# Patient Record
Sex: Male | Born: 1945 | Race: White | Hispanic: No | Marital: Married | Smoking: Former smoker
Health system: Southern US, Community
[De-identification: ages and names within clinical notes are randomized; demographics above are authoritative.]

## PROBLEM LIST (undated history)

## (undated) DIAGNOSIS — D369 Benign neoplasm, unspecified site: Secondary | ICD-10-CM

## (undated) DIAGNOSIS — I2510 Atherosclerotic heart disease of native coronary artery without angina pectoris: Secondary | ICD-10-CM

## (undated) DIAGNOSIS — F17200 Nicotine dependence, unspecified, uncomplicated: Secondary | ICD-10-CM

## (undated) DIAGNOSIS — I712 Thoracic aortic aneurysm, without rupture: Secondary | ICD-10-CM

## (undated) DIAGNOSIS — E119 Type 2 diabetes mellitus without complications: Secondary | ICD-10-CM

## (undated) DIAGNOSIS — I7121 Aneurysm of the ascending aorta, without rupture: Secondary | ICD-10-CM

## (undated) DIAGNOSIS — E7800 Pure hypercholesterolemia, unspecified: Secondary | ICD-10-CM

## (undated) DIAGNOSIS — I10 Essential (primary) hypertension: Secondary | ICD-10-CM

## (undated) HISTORY — DX: Aneurysm of the ascending aorta, without rupture: I71.21

## (undated) HISTORY — PX: EYE SURGERY: SHX253

## (undated) HISTORY — DX: Pure hypercholesterolemia, unspecified: E78.00

## (undated) HISTORY — DX: Thoracic aortic aneurysm, without rupture: I71.2

## (undated) HISTORY — DX: Atherosclerotic heart disease of native coronary artery without angina pectoris: I25.10

## (undated) HISTORY — DX: Essential (primary) hypertension: I10

## (undated) HISTORY — DX: Type 2 diabetes mellitus without complications: E11.9

## (undated) HISTORY — DX: Nicotine dependence, unspecified, uncomplicated: F17.200

## (undated) HISTORY — DX: Benign neoplasm, unspecified site: D36.9

---

## 2000-02-06 HISTORY — PX: LACERATION REPAIR: SHX5168

## 2000-12-04 ENCOUNTER — Encounter: Payer: Self-pay | Admitting: Emergency Medicine

## 2000-12-04 ENCOUNTER — Ambulatory Visit (HOSPITAL_COMMUNITY): Admission: EM | Admit: 2000-12-04 | Discharge: 2000-12-04 | Payer: Self-pay | Admitting: *Deleted

## 2006-03-01 ENCOUNTER — Encounter: Admission: RE | Admit: 2006-03-01 | Discharge: 2006-03-01 | Payer: Self-pay | Admitting: Family Medicine

## 2012-10-16 ENCOUNTER — Other Ambulatory Visit: Payer: Self-pay | Admitting: Family Medicine

## 2012-10-16 DIAGNOSIS — Z136 Encounter for screening for cardiovascular disorders: Secondary | ICD-10-CM

## 2012-11-03 ENCOUNTER — Ambulatory Visit
Admission: RE | Admit: 2012-11-03 | Discharge: 2012-11-03 | Disposition: A | Discharge status: Home / Self Care | Payer: Medicare Other | Source: Ambulatory Visit | Attending: Family Medicine | Admitting: Family Medicine

## 2012-11-03 DIAGNOSIS — Z136 Encounter for screening for cardiovascular disorders: Secondary | ICD-10-CM

## 2015-03-11 DIAGNOSIS — R768 Other specified abnormal immunological findings in serum: Secondary | ICD-10-CM | POA: Diagnosis not present

## 2015-03-11 DIAGNOSIS — I10 Essential (primary) hypertension: Secondary | ICD-10-CM | POA: Diagnosis not present

## 2015-03-17 DIAGNOSIS — H2511 Age-related nuclear cataract, right eye: Secondary | ICD-10-CM | POA: Diagnosis not present

## 2015-03-25 DIAGNOSIS — Z1211 Encounter for screening for malignant neoplasm of colon: Secondary | ICD-10-CM | POA: Diagnosis not present

## 2015-04-26 DIAGNOSIS — I10 Essential (primary) hypertension: Secondary | ICD-10-CM | POA: Diagnosis not present

## 2015-04-26 DIAGNOSIS — E7800 Pure hypercholesterolemia, unspecified: Secondary | ICD-10-CM | POA: Diagnosis not present

## 2015-04-27 DIAGNOSIS — I10 Essential (primary) hypertension: Secondary | ICD-10-CM | POA: Diagnosis not present

## 2015-04-27 DIAGNOSIS — E7800 Pure hypercholesterolemia, unspecified: Secondary | ICD-10-CM | POA: Diagnosis not present

## 2015-07-05 DIAGNOSIS — E7800 Pure hypercholesterolemia, unspecified: Secondary | ICD-10-CM | POA: Diagnosis not present

## 2015-10-18 DIAGNOSIS — I10 Essential (primary) hypertension: Secondary | ICD-10-CM | POA: Diagnosis not present

## 2015-10-18 DIAGNOSIS — F17210 Nicotine dependence, cigarettes, uncomplicated: Secondary | ICD-10-CM | POA: Diagnosis not present

## 2015-10-18 DIAGNOSIS — F17200 Nicotine dependence, unspecified, uncomplicated: Secondary | ICD-10-CM | POA: Diagnosis not present

## 2015-10-18 DIAGNOSIS — Z0001 Encounter for general adult medical examination with abnormal findings: Secondary | ICD-10-CM | POA: Diagnosis not present

## 2015-10-18 DIAGNOSIS — Z79899 Other long term (current) drug therapy: Secondary | ICD-10-CM | POA: Diagnosis not present

## 2015-10-18 DIAGNOSIS — E7800 Pure hypercholesterolemia, unspecified: Secondary | ICD-10-CM | POA: Diagnosis not present

## 2015-10-26 ENCOUNTER — Telehealth: Payer: Self-pay | Admitting: Acute Care

## 2015-10-26 NOTE — Telephone Encounter (Signed)
Wife called once again. Advised a note was back.

## 2015-10-26 NOTE — Telephone Encounter (Signed)
Pts wife is calling again to speak with SG> will forward message to her.

## 2015-10-27 NOTE — Telephone Encounter (Signed)
I have returned this call per the request. Ms. Mathus wanted to complete the questionnaire to determine if her husband was going to qualify for screening. I completed the questionnaire as requested and told Ms. Beardmore that Estill Bamberg will call her to schedule the appointment.She verbalized understanding and will await Amanda's call.

## 2015-10-28 ENCOUNTER — Telehealth: Payer: Self-pay | Admitting: Acute Care

## 2015-10-28 DIAGNOSIS — F17210 Nicotine dependence, cigarettes, uncomplicated: Secondary | ICD-10-CM

## 2015-11-02 NOTE — Telephone Encounter (Signed)
Lmtcb x1

## 2015-11-02 NOTE — Telephone Encounter (Signed)
Called spoke with pt's wife.  SDMV scheduled for 12/07/15 at 9am.  CT order placed.  She voiced understanding and had no further questions.

## 2015-11-02 NOTE — Telephone Encounter (Signed)
Pt wife calling back again to speak to nurse about scheduling appoint for the lung screening, please advice.Tristan Sims

## 2015-12-07 ENCOUNTER — Ambulatory Visit (INDEPENDENT_AMBULATORY_CARE_PROVIDER_SITE_OTHER)
Admission: RE | Admit: 2015-12-07 | Discharge: 2015-12-07 | Disposition: A | Discharge status: Home / Self Care | Payer: Medicare Other | Source: Ambulatory Visit | Attending: Acute Care | Admitting: Acute Care

## 2015-12-07 ENCOUNTER — Ambulatory Visit (INDEPENDENT_AMBULATORY_CARE_PROVIDER_SITE_OTHER): Payer: Medicare Other | Admitting: Acute Care

## 2015-12-07 ENCOUNTER — Telehealth: Payer: Self-pay | Admitting: Acute Care

## 2015-12-07 ENCOUNTER — Encounter: Payer: Self-pay | Admitting: Acute Care

## 2015-12-07 DIAGNOSIS — F17210 Nicotine dependence, cigarettes, uncomplicated: Secondary | ICD-10-CM | POA: Diagnosis not present

## 2015-12-07 DIAGNOSIS — Z87891 Personal history of nicotine dependence: Secondary | ICD-10-CM | POA: Diagnosis not present

## 2015-12-07 NOTE — Progress Notes (Signed)
Shared Decision Making Visit Lung Cancer Screening Program [phone])   Eligibility:  Age 70 y.o.  Pack Years Smoking History Calculation 106 pack year smoking history (# packs/per year x # years smoked)  Recent History of coughing up blood  no  Unexplained weight loss? no ( >Than 15 pounds within the last 6 months )  Prior History Lung / other cancer no (Diagnosis within the last 5 years already requiring surveillance chest CT Scans).  Smoking Status Current Smoker  Former Smokers: Years since quit: NA   Quit Date: NA  Visit Components:  Discussion included one or more decision making aids. yes  Discussion included risk/benefits of screening. yes  Discussion included potential follow up diagnostic testing for abnormal scans. yes  Discussion included meaning and risk of over diagnosis. yes  Discussion included meaning and risk of False Positives. yes  Discussion included meaning of total radiation exposure. yes  Counseling Included:  Importance of adherence to annual lung cancer LDCT screening. yes  Impact of comorbidities on ability to participate in the program. yes  Ability and willingness to under diagnostic treatment. yes  Smoking Cessation Counseling:  Current Smokers:   Discussed importance of smoking cessation. yes  Information about tobacco cessation classes and interventions provided to patient. yes  Patient provided with "ticket" for LDCT Scan. yes  Symptomatic Patient. no  Counseling  Diagnosis Code: Tobacco Use Z72.0  Asymptomatic Patient yes  Counseling (Intermediate counseling: > three minutes counseling) [id number]  Former Smokers:   Discussed the importance of maintaining cigarette abstinence. yes  Diagnosis Code: Personal History of Nicotine Dependence. [id number]  Information about tobacco cessation classes and interventions provided to patient. Yes  Patient provided with "ticket" for LDCT Scan. yes  Written Order for Lung Cancer  Screening with LDCT placed in Epic. Yes (CT Chest Lung Cancer Screening Low Dose W/O CM) [id number] Z12.2-Screening of respiratory organs Z87.891-Personal history of nicotine dependence  I have spent 20 minutes of face to face time with Mr. And Mrs. Gayler discussing the risks and benefits of lung cancer screening. We viewed a power point together that explained in detail the above noted topics. We paused at intervals to allow for questions to be asked and answered to ensure understanding.We discussed that the single most powerful action that he can take to decrease his risk of developing lung cancer is to quit smoking. We discussed whether or not he is ready to commit to setting a quit date. We discussed options for tools to aid in quitting smoking including nicotine replacement therapy, non-nicotine medications, support groups, Quit Smart classes, and behavior modification. We discussed that often times setting smaller, more achievable goals, such as eliminating 1 cigarette a day for a week and then 2 cigarettes a day for a week can be helpful in slowly decreasing the number of cigarettes smoked. This allows for a sense of accomplishment as well as providing a clinical benefit. I gave him  the " Be Stronger Than Your Excuses" card with contact information for community resources, classes, free nicotine replacement therapy, and access to mobile apps, text messaging, and on-line smoking cessation help. I have also given him  my card and contact information in the event he needs to contact me. We discussed the time and location of the scan, and that either June Leap, CMA, or I will call with the results within 24-48 hours of receiving them. I have provided him with a copy of the power point we viewed  as a resource in the  event they need reinforcement of the concepts we discussed today in the office. The patient verbalized understanding of all of  the above and had no further questions upon leaving the office.  They have my contact information in the event they have any further questions.   Magdalen Spatz, NP 12/07/2015

## 2015-12-07 NOTE — Telephone Encounter (Signed)
I have called the results of [name] to his wife [name] who was with him at the shared decision-making visit today. I explained to her that his scan was read as a Lung  RADS 3, nodules that are probably benign findings, short term follow up suggested: includes nodules with a low likelihood of becoming a clinically active cancer. Radiology recommends a 6 month repeat LDCT follow up. I told her that we will order and schedule the 6 month follow-up scan in April 2018.. I explained that there was an additional finding of coronary artery atherosclerosis and aortic atherosclerosis. The patient is currently being treated by his primary care physician with a statin medication. I explained to [name] that I will forward the results of the scan to the patient's primary care physician for completeness of his medical record. She has verbalized understanding of the above, and will let [name] know when he arrives home today. She had no further questions upon completion of the call. She has my contact information in the event they have questions in the future.

## 2016-04-16 DIAGNOSIS — R7301 Impaired fasting glucose: Secondary | ICD-10-CM | POA: Diagnosis not present

## 2016-04-24 DIAGNOSIS — E119 Type 2 diabetes mellitus without complications: Secondary | ICD-10-CM | POA: Diagnosis not present

## 2016-04-24 DIAGNOSIS — I10 Essential (primary) hypertension: Secondary | ICD-10-CM | POA: Diagnosis not present

## 2016-06-06 ENCOUNTER — Ambulatory Visit (INDEPENDENT_AMBULATORY_CARE_PROVIDER_SITE_OTHER)
Admission: RE | Admit: 2016-06-06 | Discharge: 2016-06-06 | Disposition: A | Discharge status: Home / Self Care | Payer: Medicare Other | Source: Ambulatory Visit | Attending: Acute Care | Admitting: Acute Care

## 2016-06-06 DIAGNOSIS — R911 Solitary pulmonary nodule: Secondary | ICD-10-CM

## 2016-06-06 DIAGNOSIS — F17210 Nicotine dependence, cigarettes, uncomplicated: Secondary | ICD-10-CM

## 2016-06-11 ENCOUNTER — Telehealth: Payer: Self-pay | Admitting: Acute Care

## 2016-06-11 DIAGNOSIS — F17210 Nicotine dependence, cigarettes, uncomplicated: Secondary | ICD-10-CM

## 2016-06-12 NOTE — Telephone Encounter (Signed)
Patient's wife returning call for CT results, states she will be leaving town in the am and may be reached at [phone].

## 2016-06-13 NOTE — Telephone Encounter (Signed)
Will forward to the lung screening pool 

## 2016-06-13 NOTE — Telephone Encounter (Signed)
Patient wife returning Denise's call - she can be reached at [phone] -pr

## 2016-06-15 NOTE — Telephone Encounter (Signed)
Pt's wife informed of CT results per Sarah Groce, NP.  Pt's wife verbalized understanding.  Copy sent to PCP.  Order placed for 1 yr f/u CT.  

## 2016-07-05 DIAGNOSIS — H43813 Vitreous degeneration, bilateral: Secondary | ICD-10-CM | POA: Diagnosis not present

## 2016-07-05 DIAGNOSIS — E119 Type 2 diabetes mellitus without complications: Secondary | ICD-10-CM | POA: Diagnosis not present

## 2016-07-05 DIAGNOSIS — Z961 Presence of intraocular lens: Secondary | ICD-10-CM | POA: Diagnosis not present

## 2016-07-05 DIAGNOSIS — H43811 Vitreous degeneration, right eye: Secondary | ICD-10-CM | POA: Diagnosis not present

## 2016-07-27 DIAGNOSIS — E119 Type 2 diabetes mellitus without complications: Secondary | ICD-10-CM | POA: Diagnosis not present

## 2016-07-27 DIAGNOSIS — Z79899 Other long term (current) drug therapy: Secondary | ICD-10-CM | POA: Diagnosis not present

## 2016-07-27 DIAGNOSIS — I10 Essential (primary) hypertension: Secondary | ICD-10-CM | POA: Diagnosis not present

## 2016-10-22 DIAGNOSIS — D126 Benign neoplasm of colon, unspecified: Secondary | ICD-10-CM | POA: Diagnosis not present

## 2016-10-22 DIAGNOSIS — Z1211 Encounter for screening for malignant neoplasm of colon: Secondary | ICD-10-CM | POA: Diagnosis not present

## 2016-10-22 DIAGNOSIS — K648 Other hemorrhoids: Secondary | ICD-10-CM | POA: Diagnosis not present

## 2016-10-24 DIAGNOSIS — Z0001 Encounter for general adult medical examination with abnormal findings: Secondary | ICD-10-CM | POA: Diagnosis not present

## 2016-10-24 DIAGNOSIS — I10 Essential (primary) hypertension: Secondary | ICD-10-CM | POA: Diagnosis not present

## 2016-10-24 DIAGNOSIS — E7800 Pure hypercholesterolemia, unspecified: Secondary | ICD-10-CM | POA: Diagnosis not present

## 2016-10-24 DIAGNOSIS — F17200 Nicotine dependence, unspecified, uncomplicated: Secondary | ICD-10-CM | POA: Diagnosis not present

## 2016-10-25 DIAGNOSIS — Z1211 Encounter for screening for malignant neoplasm of colon: Secondary | ICD-10-CM | POA: Diagnosis not present

## 2016-10-25 DIAGNOSIS — D126 Benign neoplasm of colon, unspecified: Secondary | ICD-10-CM | POA: Diagnosis not present

## 2016-11-30 DIAGNOSIS — Z0000 Encounter for general adult medical examination without abnormal findings: Secondary | ICD-10-CM | POA: Diagnosis not present

## 2016-12-31 DIAGNOSIS — R05 Cough: Secondary | ICD-10-CM | POA: Diagnosis not present

## 2016-12-31 DIAGNOSIS — J209 Acute bronchitis, unspecified: Secondary | ICD-10-CM | POA: Diagnosis not present

## 2016-12-31 DIAGNOSIS — R509 Fever, unspecified: Secondary | ICD-10-CM | POA: Diagnosis not present

## 2017-01-09 DIAGNOSIS — I10 Essential (primary) hypertension: Secondary | ICD-10-CM | POA: Diagnosis not present

## 2017-01-09 DIAGNOSIS — J209 Acute bronchitis, unspecified: Secondary | ICD-10-CM | POA: Diagnosis not present

## 2017-04-30 DIAGNOSIS — E119 Type 2 diabetes mellitus without complications: Secondary | ICD-10-CM | POA: Diagnosis not present

## 2017-04-30 DIAGNOSIS — I10 Essential (primary) hypertension: Secondary | ICD-10-CM | POA: Diagnosis not present

## 2017-04-30 DIAGNOSIS — F17200 Nicotine dependence, unspecified, uncomplicated: Secondary | ICD-10-CM | POA: Diagnosis not present

## 2017-04-30 DIAGNOSIS — E7800 Pure hypercholesterolemia, unspecified: Secondary | ICD-10-CM | POA: Diagnosis not present

## 2017-11-05 DIAGNOSIS — I10 Essential (primary) hypertension: Secondary | ICD-10-CM | POA: Diagnosis not present

## 2017-11-05 DIAGNOSIS — Z0001 Encounter for general adult medical examination with abnormal findings: Secondary | ICD-10-CM | POA: Diagnosis not present

## 2017-11-05 DIAGNOSIS — F17200 Nicotine dependence, unspecified, uncomplicated: Secondary | ICD-10-CM | POA: Diagnosis not present

## 2017-11-05 DIAGNOSIS — E7800 Pure hypercholesterolemia, unspecified: Secondary | ICD-10-CM | POA: Diagnosis not present

## 2017-11-08 ENCOUNTER — Other Ambulatory Visit: Payer: Self-pay | Admitting: Acute Care

## 2017-11-08 DIAGNOSIS — F17210 Nicotine dependence, cigarettes, uncomplicated: Secondary | ICD-10-CM

## 2017-11-08 DIAGNOSIS — Z87891 Personal history of nicotine dependence: Secondary | ICD-10-CM

## 2017-11-08 DIAGNOSIS — Z122 Encounter for screening for malignant neoplasm of respiratory organs: Secondary | ICD-10-CM

## 2017-11-21 ENCOUNTER — Ambulatory Visit (INDEPENDENT_AMBULATORY_CARE_PROVIDER_SITE_OTHER)
Admission: RE | Admit: 2017-11-21 | Discharge: 2017-11-21 | Disposition: A | Discharge status: Home / Self Care | Payer: Medicare Other | Source: Ambulatory Visit | Attending: Acute Care | Admitting: Acute Care

## 2017-11-21 DIAGNOSIS — Z87891 Personal history of nicotine dependence: Secondary | ICD-10-CM | POA: Diagnosis not present

## 2017-11-21 DIAGNOSIS — F17210 Nicotine dependence, cigarettes, uncomplicated: Secondary | ICD-10-CM

## 2017-11-21 DIAGNOSIS — Z122 Encounter for screening for malignant neoplasm of respiratory organs: Secondary | ICD-10-CM

## 2017-11-25 ENCOUNTER — Other Ambulatory Visit: Payer: Self-pay | Admitting: Acute Care

## 2017-11-25 DIAGNOSIS — Z87891 Personal history of nicotine dependence: Secondary | ICD-10-CM

## 2017-11-25 DIAGNOSIS — F17210 Nicotine dependence, cigarettes, uncomplicated: Secondary | ICD-10-CM

## 2017-11-25 DIAGNOSIS — Z122 Encounter for screening for malignant neoplasm of respiratory organs: Secondary | ICD-10-CM

## 2018-02-04 DIAGNOSIS — H11153 Pinguecula, bilateral: Secondary | ICD-10-CM | POA: Diagnosis not present

## 2018-02-04 DIAGNOSIS — Z961 Presence of intraocular lens: Secondary | ICD-10-CM | POA: Diagnosis not present

## 2018-02-04 DIAGNOSIS — E119 Type 2 diabetes mellitus without complications: Secondary | ICD-10-CM | POA: Diagnosis not present

## 2018-02-04 DIAGNOSIS — H43393 Other vitreous opacities, bilateral: Secondary | ICD-10-CM | POA: Diagnosis not present

## 2018-02-11 DIAGNOSIS — E119 Type 2 diabetes mellitus without complications: Secondary | ICD-10-CM | POA: Diagnosis not present

## 2018-05-08 DIAGNOSIS — I712 Thoracic aortic aneurysm, without rupture: Secondary | ICD-10-CM | POA: Diagnosis not present

## 2018-05-08 DIAGNOSIS — E7800 Pure hypercholesterolemia, unspecified: Secondary | ICD-10-CM | POA: Diagnosis not present

## 2018-05-08 DIAGNOSIS — I10 Essential (primary) hypertension: Secondary | ICD-10-CM | POA: Diagnosis not present

## 2018-05-08 DIAGNOSIS — E119 Type 2 diabetes mellitus without complications: Secondary | ICD-10-CM | POA: Diagnosis not present

## 2018-08-26 DIAGNOSIS — Z79899 Other long term (current) drug therapy: Secondary | ICD-10-CM | POA: Diagnosis not present

## 2018-08-26 DIAGNOSIS — I10 Essential (primary) hypertension: Secondary | ICD-10-CM | POA: Diagnosis not present

## 2018-08-26 DIAGNOSIS — E119 Type 2 diabetes mellitus without complications: Secondary | ICD-10-CM | POA: Diagnosis not present

## 2018-08-26 DIAGNOSIS — E7800 Pure hypercholesterolemia, unspecified: Secondary | ICD-10-CM | POA: Diagnosis not present

## 2018-11-12 ENCOUNTER — Other Ambulatory Visit: Payer: Self-pay | Admitting: Family Medicine

## 2018-11-12 DIAGNOSIS — I7121 Aneurysm of the ascending aorta, without rupture: Secondary | ICD-10-CM

## 2018-11-12 DIAGNOSIS — I712 Thoracic aortic aneurysm, without rupture: Secondary | ICD-10-CM

## 2018-11-19 DIAGNOSIS — Z23 Encounter for immunization: Secondary | ICD-10-CM | POA: Diagnosis not present

## 2018-11-19 DIAGNOSIS — Z0001 Encounter for general adult medical examination with abnormal findings: Secondary | ICD-10-CM | POA: Diagnosis not present

## 2018-11-19 DIAGNOSIS — E7800 Pure hypercholesterolemia, unspecified: Secondary | ICD-10-CM | POA: Diagnosis not present

## 2018-11-19 DIAGNOSIS — I10 Essential (primary) hypertension: Secondary | ICD-10-CM | POA: Diagnosis not present

## 2018-11-20 ENCOUNTER — Ambulatory Visit
Admission: RE | Admit: 2018-11-20 | Discharge: 2018-11-20 | Disposition: A | Discharge status: Home / Self Care | Payer: Medicare Other | Source: Ambulatory Visit | Attending: Family Medicine | Admitting: Family Medicine

## 2018-11-20 DIAGNOSIS — I7121 Aneurysm of the ascending aorta, without rupture: Secondary | ICD-10-CM

## 2018-11-20 DIAGNOSIS — I712 Thoracic aortic aneurysm, without rupture: Secondary | ICD-10-CM | POA: Diagnosis not present

## 2018-11-20 MED ORDER — IOPAMIDOL (ISOVUE-370) INJECTION 76%
75.0000 mL | Freq: Once | INTRAVENOUS | Status: AC | PRN
  Administered 2018-11-20: 75 mL via INTRAVENOUS

## 2018-12-11 ENCOUNTER — Other Ambulatory Visit: Payer: Self-pay

## 2018-12-11 ENCOUNTER — Telehealth: Payer: Self-pay | Admitting: Acute Care

## 2018-12-11 ENCOUNTER — Ambulatory Visit (INDEPENDENT_AMBULATORY_CARE_PROVIDER_SITE_OTHER)
Admission: RE | Admit: 2018-12-11 | Discharge: 2018-12-11 | Disposition: A | Discharge status: Home / Self Care | Payer: Medicare Other | Source: Ambulatory Visit | Attending: Acute Care | Admitting: Acute Care

## 2018-12-11 DIAGNOSIS — Z87891 Personal history of nicotine dependence: Secondary | ICD-10-CM | POA: Diagnosis not present

## 2018-12-11 DIAGNOSIS — Z122 Encounter for screening for malignant neoplasm of respiratory organs: Secondary | ICD-10-CM

## 2018-12-11 DIAGNOSIS — F17210 Nicotine dependence, cigarettes, uncomplicated: Secondary | ICD-10-CM

## 2018-12-11 NOTE — Telephone Encounter (Signed)
I have called Lincoln Brigham, the patient's wife with the results of his low-dose CT.  I explained that his scan was read as a Lung RADS 4 A : suspicious findings, either short term follow up in 3 months or alternatively  PET Scan evaluation may be considered when there is a solid component of  8 mm or larger.  I explained that we need to do it follow-up CT in 3 months rather than waiting a year.  I explained that this will allow [place] to assess the nodule in question for stability, resolution, or continued growth.  She verbalized understanding.  I explained that someone will call closer to February 2021 to set up the scan.  She verbalized understanding.  I also explained that there was notation of Hepatic steatosis. This  is a term that describes the build up of fat in the liver. It is normal to have small amounts of fat in your liver, but when the proportion of liver cells that contain fat exceeds more than 5% it is indicative of early stage fatty liver.Treatment often involves reducing risk factors through a diet and exercise plan. It is generally a benign condition, but in a small percentage of patients it does require follow up. Please have the patient follow up with PCP regarding potential risk factor modification, dietary therapy or pharmacologic therapy if clinically indicated.  Denise please order low-dose CT follow-up in 3 months. Please fax results to PCP Dr. Dorthy Cooler. Please also make sure Dr. Raliegh Ip is aware of the fact that there was notation of hepatic steatosis on the scan. Thanks so much    .

## 2018-12-11 NOTE — Telephone Encounter (Signed)
Received call report from [place] at [facility] :   IMPRESSION: 1. Lung-RADS 4A, suspicious. Follow up low-dose chest CT without contrast in 3 months (please use the following order, "CT CHEST LCS NODULE FOLLOW-UP W/O CM") is recommended. Alternatively, PET may be considered when there is a solid component 64mm or larger. 2. Three-vessel coronary atherosclerosis. 3. Diffuse hepatic steatosis.  Aortic Atherosclerosis (ICD10-I70.0) and Emphysema (ICD10-J43.9).  Will forward to Eric Form, NP.

## 2018-12-15 NOTE — Telephone Encounter (Signed)
Order placed for 3 month f/u low dose ct. Results faxed to PCP.

## 2018-12-15 NOTE — Addendum Note (Signed)
Addended by: Doroteo Glassman D on: 12/15/2018 09:55 AM   Modules accepted: Orders

## 2018-12-27 DIAGNOSIS — Z20828 Contact with and (suspected) exposure to other viral communicable diseases: Secondary | ICD-10-CM | POA: Diagnosis not present

## 2019-03-16 ENCOUNTER — Ambulatory Visit (INDEPENDENT_AMBULATORY_CARE_PROVIDER_SITE_OTHER)
Admission: RE | Admit: 2019-03-16 | Discharge: 2019-03-16 | Disposition: A | Discharge status: Home / Self Care | Payer: Medicare Other | Source: Ambulatory Visit | Attending: Acute Care | Admitting: Acute Care

## 2019-03-16 ENCOUNTER — Other Ambulatory Visit: Payer: Self-pay

## 2019-03-16 DIAGNOSIS — Z87891 Personal history of nicotine dependence: Secondary | ICD-10-CM

## 2019-03-16 DIAGNOSIS — J439 Emphysema, unspecified: Secondary | ICD-10-CM | POA: Diagnosis not present

## 2019-03-16 DIAGNOSIS — R911 Solitary pulmonary nodule: Secondary | ICD-10-CM

## 2019-03-16 DIAGNOSIS — Z122 Encounter for screening for malignant neoplasm of respiratory organs: Secondary | ICD-10-CM

## 2019-04-02 NOTE — Progress Notes (Signed)
I have called the patient to give him the scan results. There was no answer. I have left a message asking that the patient call the office for his results. We will await return call. If we do not hear back we will call the patient again.

## 2019-04-03 ENCOUNTER — Telehealth: Payer: Self-pay | Admitting: Acute Care

## 2019-04-03 DIAGNOSIS — Z87891 Personal history of nicotine dependence: Secondary | ICD-10-CM

## 2019-04-07 NOTE — Telephone Encounter (Signed)
Spoke with pt and advised that per radiology recommendation we will repeat CT in 6 months to f/u on the stability of nodules that were noted on recent scan. Pt verbalized understanding. Copy of CT sent to PCP. Order placed for 6 mth f/u low dose chest ct. Nothing further needed at this time.

## 2019-05-20 DIAGNOSIS — E7800 Pure hypercholesterolemia, unspecified: Secondary | ICD-10-CM | POA: Diagnosis not present

## 2019-05-20 DIAGNOSIS — E119 Type 2 diabetes mellitus without complications: Secondary | ICD-10-CM | POA: Diagnosis not present

## 2019-05-20 DIAGNOSIS — I712 Thoracic aortic aneurysm, without rupture: Secondary | ICD-10-CM | POA: Diagnosis not present

## 2019-05-20 DIAGNOSIS — I10 Essential (primary) hypertension: Secondary | ICD-10-CM | POA: Diagnosis not present

## 2019-05-26 ENCOUNTER — Other Ambulatory Visit: Payer: Self-pay | Admitting: Family Medicine

## 2019-05-26 DIAGNOSIS — R911 Solitary pulmonary nodule: Secondary | ICD-10-CM

## 2019-06-03 DIAGNOSIS — Z7984 Long term (current) use of oral hypoglycemic drugs: Secondary | ICD-10-CM | POA: Diagnosis not present

## 2019-06-03 DIAGNOSIS — Z79899 Other long term (current) drug therapy: Secondary | ICD-10-CM | POA: Diagnosis not present

## 2019-06-03 DIAGNOSIS — E119 Type 2 diabetes mellitus without complications: Secondary | ICD-10-CM | POA: Diagnosis not present

## 2019-06-16 ENCOUNTER — Ambulatory Visit: Payer: Medicare Other | Admitting: Cardiovascular Disease

## 2019-06-16 ENCOUNTER — Other Ambulatory Visit: Payer: Self-pay

## 2019-06-16 ENCOUNTER — Encounter (INDEPENDENT_AMBULATORY_CARE_PROVIDER_SITE_OTHER): Payer: Self-pay

## 2019-06-16 ENCOUNTER — Encounter: Payer: Self-pay | Admitting: Cardiovascular Disease

## 2019-06-16 VITALS — BP 132/78 | HR 60 | Ht 69.0 in | Wt 200.8 lb

## 2019-06-16 DIAGNOSIS — I2510 Atherosclerotic heart disease of native coronary artery without angina pectoris: Secondary | ICD-10-CM | POA: Diagnosis not present

## 2019-06-16 DIAGNOSIS — E785 Hyperlipidemia, unspecified: Secondary | ICD-10-CM

## 2019-06-16 DIAGNOSIS — I2584 Coronary atherosclerosis due to calcified coronary lesion: Secondary | ICD-10-CM

## 2019-06-16 LAB — HEPATIC FUNCTION PANEL
ALT: 70 IU/L — ABNORMAL HIGH (ref 0–44)
AST: 48 IU/L — ABNORMAL HIGH (ref 0–40)
Albumin: 4.4 g/dL (ref 3.7–4.7)
Alkaline Phosphatase: 52 IU/L (ref 39–117)
Bilirubin Total: 0.4 mg/dL (ref 0.0–1.2)
Bilirubin, Direct: 0.18 mg/dL (ref 0.00–0.40)
Total Protein: 6.8 g/dL (ref 6.0–8.5)

## 2019-06-16 LAB — BASIC METABOLIC PANEL
BUN/Creatinine Ratio: 18 (ref 10–24)
BUN: 15 mg/dL (ref 8–27)
CO2: 22 mmol/L (ref 20–29)
Calcium: 9.6 mg/dL (ref 8.6–10.2)
Chloride: 106 mmol/L (ref 96–106)
Creatinine, Ser: 0.84 mg/dL (ref 0.76–1.27)
GFR calc Af Amer: 100 mL/min/{1.73_m2} (ref >59)
GFR calc non Af Amer: 87 mL/min/{1.73_m2} (ref >59)
Glucose: 143 mg/dL — ABNORMAL HIGH (ref 65–99)
Potassium: 4.2 mmol/L (ref 3.5–5.2)
Sodium: 141 mmol/L (ref 134–144)

## 2019-06-16 LAB — LIPID PANEL
Chol/HDL Ratio: 3.9 ratio (ref 0.0–5.0)
Cholesterol, Total: 118 mg/dL (ref 100–199)
HDL: 30 mg/dL — ABNORMAL LOW (ref >39)
LDL Chol Calc (NIH): 53 mg/dL (ref 0–99)
Triglycerides: 217 mg/dL — ABNORMAL HIGH (ref 0–149)
VLDL Cholesterol Cal: 35 mg/dL (ref 5–40)

## 2019-06-16 NOTE — Patient Instructions (Signed)
Medication Instructions:  Your physician recommends that you continue on your current medications as directed. Please refer to the Current Medication list given to you today.  *If you need a refill on your cardiac medications before your next appointment, please call your pharmacy*   Lab Work: TODAY - cholesterol, liver panel, BMET (kidney function/electrolytes) If you have labs (blood work) drawn today and your tests are completely normal, you will receive your results only by: Marland Kitchen MyChart Message (if you have MyChart) OR . A paper copy in the mail If you have any lab test that is abnormal or we need to change your treatment, we will call you to review the results.   Testing/Procedures: None Ordered   Follow-Up: At [facility], you and your health needs are our priority.  As part of our continuing mission to provide you with exceptional heart care, we have created designated Provider Care Teams.  These Care Teams include your primary Cardiologist (physician) and Advanced Practice Providers (APPs -  Physician Assistants and Nurse Practitioners) who all work together to provide you with the care you need, when you need it.  We recommend signing up for the patient portal called "MyChart".  Sign up information is provided on this After Visit Summary.  MyChart is used to connect with patients for Virtual Visits (Telemedicine).  Patients are able to view lab/test results, encounter notes, upcoming appointments, etc.  Non-urgent messages can be sent to your provider as well.   To learn more about what you can do with MyChart, go to [email or web address].    Your next appointment:   1 year(s)  The format for your next appointment:   In Person  Provider:   You may see Mertie Moores, MD or one of the following Advanced Practice Providers on your designated Care Team:    Richardson Dopp, PA-C  [place], [place]

## 2019-06-16 NOTE — Progress Notes (Signed)
Cardiology Office Note:    Date:  06/16/2019   ID:  Tristan Sims, DOB 03/24/1945, MRN [id number]  PCP:  [name], MD  Cardiologist:  No primary care provider on file.  Electrophysiologist:  None   Referring MD: [name], MD   Chief Complaint  Patient presents with  . Coronary Artery Disease     History of Present Illness:    Tristan Seaberry is a 74 y.o. male with a hx of hypertension, diabetes mellitus and hyperlipidemia.  He also has a history of an ascending aortic aneurysm.  He had a CT scan recently that revealed some coronary artery calcifications.  We are asked to see him today for further evaluation of his coronary artery calcifications.  Recent CT scan reveals an ascending aorta measuring 4.0 cm.     Labs from his primary medical doctor reveals a total cholesterol of 110.  The triglyceride level was 218.  His HDL is 29.  LDL is 37.  Is very active.  Is a Museum/gallery curator.   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  No CP or tightness   Former smoker ,  Quit 2 years ago .     Past Medical History:  Diagnosis Date  . Adenomatous polyps   . Ascending aortic aneurysm ([place])   . CAD (coronary artery disease)   . DM (diabetes mellitus), type 2 ([place])   . HTN (hypertension)   . Hypercholesteremia   . Tobacco dependence     History reviewed. No pertinent surgical history.  Current Medications: Current Meds  Medication Sig  . atorvastatin (LIPITOR) 10 MG tablet Take 10 mg by mouth daily.  . cyclobenzaprine (FLEXERIL) 10 MG tablet Take 10 mg by mouth 3 (three) times daily as needed for muscle spasms.  Marland Kitchen lisinopril (ZESTRIL) 40 MG tablet Take 40 mg by mouth daily.  . metFORMIN (GLUCOPHAGE) 1000 MG tablet Take 1,000 mg by mouth 2 (two) times daily with a meal.  . naproxen (NAPROSYN) 500 MG tablet Take 500 mg by mouth 2 (two) times daily with a meal.     Allergies:   Aspirin and Penicillins   Social History   Socioeconomic History  . Marital status: Married     Spouse name: Not on file  . Number of children: Not on file  . Years of education: Not on file  . Highest education level: Not on file  Occupational History  . Not on file  Tobacco Use  . Smoking status: Current Every Day Smoker    Packs/day: 2.00    Years: 53.00    Pack years: 106.00    Types: Cigarettes  . Smokeless tobacco: Never Used  . Tobacco comment: Counseled about need to quit smoking  Substance and Sexual Activity  . Alcohol use: Not on file  . Drug use: Not on file  . Sexual activity: Not on file  Other Topics Concern  . Not on file  Social History Narrative  . Not on file   Social Determinants of Health   Financial Resource Strain:   . Difficulty of Paying Living Expenses:   Food Insecurity:   . Worried About Charity fundraiser in the Last Year:   . Arboriculturist in the Last Year:   Transportation Needs:   . Film/video editor (Medical):   Marland Kitchen Lack of Transportation (Non-Medical):   Physical Activity:   . Days of Exercise per Week:   . Minutes of Exercise per Session:   Stress:   .  Feeling of Stress :   Social Connections:   . Frequency of Communication with Friends and Family:   . Frequency of Social Gatherings with Friends and Family:   . Attends Religious Services:   . Active Member of Clubs or Organizations:   . Attends Archivist Meetings:   Marland Kitchen Marital Status:      Family History: The patient's family history includes Healthy in his father and mother.  Mother and father lived in [place].   Never went to the doctor   ROS:   Please see the history of present illness.     All other systems reviewed and are negative.  EKGs/Labs/Other Studies Reviewed:    The following studies were reviewed today:     Recent Labs: 06/16/2019: ALT 70; BUN 15; Creatinine, Ser 0.84; Potassium 4.2; Sodium 141  Recent Lipid Panel    Component Value Date/Time   CHOL 118 06/16/2019 0919   TRIG 217 (H) 06/16/2019 0919   HDL 30 (L) 06/16/2019  0919   CHOLHDL 3.9 06/16/2019 0919   LDLCALC 53 06/16/2019 0919    Physical Exam:    VS:  BP 132/78   Pulse 60   Ht 5\' 9"  (1.753 m)   Wt 200 lb 12 oz (91.1 kg)   SpO2 97%   BMI 29.65 kg/m     Wt Readings from Last 3 Encounters:  06/16/19 200 lb 12 oz (91.1 kg)     GEN: Middle-aged gentleman, no acute distress HEENT: Normal NECK: No JVD; No carotid bruits LYMPHATICS: No lymphadenopathy CARDIAC: RRR, no murmurs, rubs, gallops RESPIRATORY:  Clear to auscultation without rales, wheezing or rhonchi  ABDOMEN: Soft, non-tender, non-distended MUSCULOSKELETAL:  No edema; No deformity distal leg pulses are 1-2+. SKIN: Warm and dry NEUROLOGIC:  Alert and oriented x 3 PSYCHIATRIC:  Normal affect   EKG:    Normal sinus rhythm at 62.  No ST or T wave changes.  ASSESSMENT:    1. Coronary artery calcification seen on CT scan   2. Hyperlipidemia, unspecified hyperlipidemia type    PLAN:    In order of problems listed above:  1. Coronary artery calcifications: Tristan Sims was recently found to have coronary artery calcifications.  This was an incidental finding in follow-up of his ascending aortic dilatation.  He has three-vessel coronary artery calcifications.  He does not have any angina.  His lipids are very well controlled with an LDL of 37.  At this point I do not think that he needs any further work-up.  He is completely asymptomatic and his LDL is quite low.  He has stopped smoking.   I have asked him to get some regular exercise.  I told him to give [place] a call if he develops any episodes of angina   .  2.  Hyperlipidemia: He is currently on atorvastatin.  Will check lipids, liver enzymes, basic metabolic profile today.   Medication Adjustments/Labs and Tests Ordered: Current medicines are reviewed at length with the patient today.  Concerns regarding medicines are outlined above.  Orders Placed This Encounter  Procedures  . Lipid Profile  . Basic Metabolic Panel (BMET)  .  Hepatic function panel  . EKG 12-Lead   No orders of the defined types were placed in this encounter.   Patient Instructions  Medication Instructions:  Your physician recommends that you continue on your current medications as directed. Please refer to the Current Medication list given to you today.  *If you need a refill on  your cardiac medications before your next appointment, please call your pharmacy*   Lab Work: TODAY - cholesterol, liver panel, BMET (kidney function/electrolytes) If you have labs (blood work) drawn today and your tests are completely normal, you will receive your results only by: Marland Kitchen MyChart Message (if you have MyChart) OR . A paper copy in the mail If you have any lab test that is abnormal or we need to change your treatment, we will call you to review the results.   Testing/Procedures: None Ordered   Follow-Up: At [facility], you and your health needs are our priority.  As part of our continuing mission to provide you with exceptional heart care, we have created designated Provider Care Teams.  These Care Teams include your primary Cardiologist (physician) and Advanced Practice Providers (APPs -  Physician Assistants and Nurse Practitioners) who all work together to provide you with the care you need, when you need it.  We recommend signing up for the patient portal called "MyChart".  Sign up information is provided on this After Visit Summary.  MyChart is used to connect with patients for Virtual Visits (Telemedicine).  Patients are able to view lab/test results, encounter notes, upcoming appointments, etc.  Non-urgent messages can be sent to your provider as well.   To learn more about what you can do with MyChart, go to [email or web address].    Your next appointment:   1 year(s)  The format for your next appointment:   In Person  Provider:   You may see Mertie Moores, MD or one of the following Advanced Practice Providers on your designated Care  Team:    Richardson Dopp, PA-C  [place]bie [place], [place]         Signed, Mertie Moores, MD  06/16/2019 5:35 PM    [place]

## 2019-08-18 DIAGNOSIS — R1013 Epigastric pain: Secondary | ICD-10-CM | POA: Diagnosis not present

## 2019-08-18 DIAGNOSIS — E119 Type 2 diabetes mellitus without complications: Secondary | ICD-10-CM | POA: Diagnosis not present

## 2019-08-18 DIAGNOSIS — Z7984 Long term (current) use of oral hypoglycemic drugs: Secondary | ICD-10-CM | POA: Diagnosis not present

## 2019-09-09 ENCOUNTER — Inpatient Hospital Stay: Admission: RE | Admit: 2019-09-09 | Payer: Medicare Other | Source: Ambulatory Visit

## 2019-09-14 ENCOUNTER — Ambulatory Visit
Admission: RE | Admit: 2019-09-14 | Discharge: 2019-09-14 | Disposition: A | Discharge status: Home / Self Care | Payer: Medicare Other | Source: Ambulatory Visit | Attending: Acute Care | Admitting: Acute Care

## 2019-09-14 DIAGNOSIS — Z122 Encounter for screening for malignant neoplasm of respiratory organs: Secondary | ICD-10-CM | POA: Diagnosis not present

## 2019-09-14 DIAGNOSIS — J984 Other disorders of lung: Secondary | ICD-10-CM | POA: Diagnosis not present

## 2019-09-14 DIAGNOSIS — J432 Centrilobular emphysema: Secondary | ICD-10-CM | POA: Diagnosis not present

## 2019-09-14 DIAGNOSIS — I700 Atherosclerosis of aorta: Secondary | ICD-10-CM | POA: Diagnosis not present

## 2019-09-14 DIAGNOSIS — Z87891 Personal history of nicotine dependence: Secondary | ICD-10-CM

## 2019-09-14 NOTE — Progress Notes (Signed)
Please call patient and let them  know their  low dose Ct was read as a Lung RADS 2: nodules that are benign in appearance and behavior with a very low likelihood of becoming a clinically active cancer due to size or lack of growth. Recommendation per radiology is for a repeat LDCT in 12 months. .Please let them  know we will order and schedule their  annual screening scan for 09/2020. Please let them  know there was notation of CAD on their  scan.  Please remind the patient  that this is a non-gated exam therefore degree or severity of disease  cannot be determined. Please have them  follow up with their PCP regarding potential risk factor modification, dietary therapy or pharmacologic therapy if clinically indicated. Pt.  is  currently on statin therapy. Please place order for annual  screening scan for  09/2020 and fax results to PCP. Thanks so much.  Please let them know the nodule that was first noted is stable and therefore 12 month follow up. Thanks so much [employer]

## 2019-09-21 ENCOUNTER — Telehealth: Payer: Self-pay | Admitting: Acute Care

## 2019-09-21 DIAGNOSIS — K760 Fatty (change of) liver, not elsewhere classified: Secondary | ICD-10-CM | POA: Diagnosis not present

## 2019-09-21 DIAGNOSIS — E119 Type 2 diabetes mellitus without complications: Secondary | ICD-10-CM | POA: Diagnosis not present

## 2019-09-21 DIAGNOSIS — N481 Balanitis: Secondary | ICD-10-CM | POA: Diagnosis not present

## 2019-09-21 DIAGNOSIS — Z87891 Personal history of nicotine dependence: Secondary | ICD-10-CM

## 2019-09-21 NOTE — Telephone Encounter (Signed)
Pt's wife Pam  informed of CT results per Eric Form, NP. She verbalized understanding.  Copy sent to PCP.  Order placed for 1 yr f/u CT.

## 2019-11-11 ENCOUNTER — Other Ambulatory Visit: Payer: Medicare Other

## 2019-11-24 DIAGNOSIS — E119 Type 2 diabetes mellitus without complications: Secondary | ICD-10-CM | POA: Diagnosis not present

## 2019-11-24 DIAGNOSIS — E7800 Pure hypercholesterolemia, unspecified: Secondary | ICD-10-CM | POA: Diagnosis not present

## 2019-11-24 DIAGNOSIS — Z79899 Other long term (current) drug therapy: Secondary | ICD-10-CM | POA: Diagnosis not present

## 2019-11-24 DIAGNOSIS — Z0001 Encounter for general adult medical examination with abnormal findings: Secondary | ICD-10-CM | POA: Diagnosis not present

## 2019-12-10 DIAGNOSIS — R059 Cough, unspecified: Secondary | ICD-10-CM | POA: Diagnosis not present

## 2019-12-10 DIAGNOSIS — K760 Fatty (change of) liver, not elsewhere classified: Secondary | ICD-10-CM | POA: Diagnosis not present

## 2019-12-10 DIAGNOSIS — E7800 Pure hypercholesterolemia, unspecified: Secondary | ICD-10-CM | POA: Diagnosis not present

## 2019-12-10 DIAGNOSIS — J069 Acute upper respiratory infection, unspecified: Secondary | ICD-10-CM | POA: Diagnosis not present

## 2019-12-10 DIAGNOSIS — I2510 Atherosclerotic heart disease of native coronary artery without angina pectoris: Secondary | ICD-10-CM | POA: Diagnosis not present

## 2019-12-10 DIAGNOSIS — Z0001 Encounter for general adult medical examination with abnormal findings: Secondary | ICD-10-CM | POA: Diagnosis not present

## 2020-02-29 DIAGNOSIS — E119 Type 2 diabetes mellitus without complications: Secondary | ICD-10-CM | POA: Diagnosis not present

## 2020-02-29 DIAGNOSIS — E1169 Type 2 diabetes mellitus with other specified complication: Secondary | ICD-10-CM | POA: Diagnosis not present

## 2020-03-09 DIAGNOSIS — Z79899 Other long term (current) drug therapy: Secondary | ICD-10-CM | POA: Diagnosis not present

## 2020-03-09 DIAGNOSIS — R7401 Elevation of levels of liver transaminase levels: Secondary | ICD-10-CM | POA: Diagnosis not present

## 2020-05-12 DIAGNOSIS — R059 Cough, unspecified: Secondary | ICD-10-CM | POA: Diagnosis not present

## 2020-05-12 DIAGNOSIS — Z03818 Encounter for observation for suspected exposure to other biological agents ruled out: Secondary | ICD-10-CM | POA: Diagnosis not present

## 2020-06-10 ENCOUNTER — Ambulatory Visit: Payer: Medicare Other | Admitting: Cardiovascular Disease

## 2020-06-10 DIAGNOSIS — I2510 Atherosclerotic heart disease of native coronary artery without angina pectoris: Secondary | ICD-10-CM | POA: Diagnosis not present

## 2020-06-10 DIAGNOSIS — E1169 Type 2 diabetes mellitus with other specified complication: Secondary | ICD-10-CM | POA: Diagnosis not present

## 2020-06-10 DIAGNOSIS — I77810 Thoracic aortic ectasia: Secondary | ICD-10-CM | POA: Diagnosis not present

## 2020-06-10 DIAGNOSIS — E7800 Pure hypercholesterolemia, unspecified: Secondary | ICD-10-CM | POA: Diagnosis not present

## 2020-08-04 ENCOUNTER — Encounter: Payer: Self-pay | Admitting: Cardiovascular Disease

## 2020-08-04 NOTE — Progress Notes (Signed)
Cardiology Office Note:    Date:  08/05/2020   ID:  Tristan Sims, DOB 12/31/1945, MRN [id number]  PCP:  [name], MD  Cardiologist:  None  Electrophysiologist:  None   Referring MD: [name], MD   Chief Complaint  Patient presents with   Coronary Artery Disease     Previous notes:    Tristan Sims is a 75 y.o. male with a hx of hypertension, diabetes mellitus and hyperlipidemia.  He also has a history of an ascending aortic aneurysm.  He had a CT scan recently that revealed some coronary artery calcifications.  We are asked to see him today for further evaluation of his coronary artery calcifications.  Recent CT scan reveals an ascending aorta measuring 4.0 cm.     Labs from his primary medical doctor reveals a total cholesterol of 110.  The triglyceride level was 218.  His HDL is 29.  LDL is 37.  Is very active.  Is a Museum/gallery curator.   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  No CP or tightness   Former smoker ,  Quit 2 years ago .     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  Has some pain / numbness in his left shoulder  Not a chest pain ,  just a MSK pain .  Check lipids today   Past Medical History:  Diagnosis Date   Adenomatous polyps    Ascending aortic aneurysm (HCC)    CAD (coronary artery disease)    DM (diabetes mellitus), type 2 (HCC)    HTN (hypertension)    Hypercholesteremia    Tobacco dependence     History reviewed. No pertinent surgical history.  Current Medications: Current Meds  Medication Sig   atorvastatin (LIPITOR) 10 MG tablet Take 10 mg by mouth daily.   JARDIANCE 10 MG TABS tablet Take 10 mg by mouth daily.   JARDIANCE 25 MG TABS tablet Take 25 mg by mouth daily.   lisinopril (ZESTRIL) 40 MG tablet Take 40 mg by mouth daily.   metFORMIN (GLUCOPHAGE) 1000 MG tablet Take 1,000 mg  by mouth 2 (two) times daily with a meal.   naproxen (NAPROSYN) 500 MG tablet Take 500 mg by mouth 2 (two) times daily with a meal.     Allergies:   Aspirin and Penicillins   Social History   Socioeconomic History   Marital status: Married    Spouse name: Not on file   Number of children: Not on file   Years of education: Not on file   Highest education level: Not on file  Occupational History   Not on file  Tobacco Use   Smoking status: Every Day    Packs/day: 2.00    Years: 53.00    Pack years: 106.00    Types: Cigarettes   Smokeless tobacco: Never   Tobacco comments:    Counseled about need to quit smoking  Substance and Sexual Activity   Alcohol use: Not on file   Drug use: Not on file   Sexual activity: Not on file  Other Topics Concern   Not on file  Social History Narrative   Not on file   Social Determinants of Health   Financial Resource Strain: Not on file  Food Insecurity: Not on file  Transportation Needs: Not on  file  Physical Activity: Not on file  Stress: Not on file  Social Connections: Not on file     Family History: The patient's family history includes Healthy in his father and mother.  Mother and father lived in [place].   Never went to the doctor   ROS:   Please see the history of present illness.     All other systems reviewed and are negative.  EKGs/Labs/Other Studies Reviewed:    The following studies were reviewed today:    Recent Labs: No results found for requested labs within last 8760 hours.  Recent Lipid Panel    Component Value Date/Time   CHOL 118 06/16/2019 0919   TRIG 217 (H) 06/16/2019 0919   HDL 30 (L) 06/16/2019 0919   CHOLHDL 3.9 06/16/2019 0919   LDLCALC 53 06/16/2019 0919    Physical Exam:   Physical Exam: Blood pressure 112/70, pulse 68, height 5\' 9"  (1.753 m), weight 193 lb 3.2 oz (87.6 kg).  GEN:  Well nourished, well developed in no acute distress HEENT: Normal NECK: No JVD; No carotid  bruits LYMPHATICS: No lymphadenopathy CARDIAC: RRR , no murmurs, rubs, gallops RESPIRATORY:  Clear to auscultation without rales, wheezing or rhonchi  ABDOMEN: Soft, non-tender, non-distended MUSCULOSKELETAL:  No edema; No deformity  SKIN: Warm and dry NEUROLOGIC:  Alert and oriented x 3  EKG:    August 05, 2020: Normal sinus rhythm.  No ST or T wave abnormalities.   ASSESSMENT:    1. Coronary artery calcification seen on CT scan   2. Hyperlipidemia, unspecified hyperlipidemia type   3. Primary hypertension     PLAN:      Coronary artery calcifications: He remains quite active.  He is not having any episodes of angina.   .  2.  Hyperlipidemia:    We will continue with aggressive lipid-lowering.  His goal LDL is between 50 and 70.  Check lipid profile and basic metabolic profile today.  Continue atorvastatin.  We will see him again in 1 year.  Medication Adjustments/Labs and Tests Ordered: Current medicines are reviewed at length with the patient today.  Concerns regarding medicines are outlined above.  Orders Placed This Encounter  Procedures   Lipid panel   Basic metabolic panel   EKG 94-BSJG    No orders of the defined types were placed in this encounter.   Patient Instructions  Medication Instructions:  Your physician recommends that you continue on your current medications as directed. Please refer to the Current Medication list given to you today.  Labwork: You will have labs drawn today: Fasting lipid panel, BMP  Testing/Procedures: None ordered.  Follow-Up: Your physician recommends that you schedule a follow-up appointment in:   12 months with Dr. Acie Fredrickson   Any Other Special Instructions Will Be Listed Below (If Applicable).     If you need a refill on your cardiac medications before your next appointment, please call your pharmacy.   Signed, Mertie Moores, MD  08/05/2020 8:20 AM    [place]

## 2020-08-05 ENCOUNTER — Encounter (INDEPENDENT_AMBULATORY_CARE_PROVIDER_SITE_OTHER): Payer: Self-pay

## 2020-08-05 ENCOUNTER — Ambulatory Visit (INDEPENDENT_AMBULATORY_CARE_PROVIDER_SITE_OTHER): Payer: Medicare Other | Admitting: Cardiovascular Disease

## 2020-08-05 ENCOUNTER — Encounter: Payer: Self-pay | Admitting: Cardiovascular Disease

## 2020-08-05 ENCOUNTER — Telehealth: Payer: Self-pay | Admitting: Cardiovascular Disease

## 2020-08-05 ENCOUNTER — Other Ambulatory Visit: Payer: Self-pay

## 2020-08-05 VITALS — BP 112/70 | HR 68 | Ht 69.0 in | Wt 193.2 lb

## 2020-08-05 DIAGNOSIS — I2510 Atherosclerotic heart disease of native coronary artery without angina pectoris: Secondary | ICD-10-CM

## 2020-08-05 DIAGNOSIS — I10 Essential (primary) hypertension: Secondary | ICD-10-CM

## 2020-08-05 DIAGNOSIS — E875 Hyperkalemia: Secondary | ICD-10-CM

## 2020-08-05 DIAGNOSIS — I2584 Coronary atherosclerosis due to calcified coronary lesion: Secondary | ICD-10-CM | POA: Diagnosis not present

## 2020-08-05 DIAGNOSIS — E785 Hyperlipidemia, unspecified: Secondary | ICD-10-CM | POA: Diagnosis not present

## 2020-08-05 LAB — BASIC METABOLIC PANEL WITH GFR
BUN/Creatinine Ratio: 32 — ABNORMAL HIGH (ref 10–24)
BUN: 49 mg/dL — ABNORMAL HIGH (ref 8–27)
CO2: 15 mmol/L — ABNORMAL LOW (ref 20–29)
Calcium: 10 mg/dL (ref 8.6–10.2)
Chloride: 106 mmol/L (ref 96–106)
Creatinine, Ser: 1.53 mg/dL — ABNORMAL HIGH (ref 0.76–1.27)
Glucose: 115 mg/dL — ABNORMAL HIGH (ref 65–99)
Potassium: 5.8 mmol/L (ref 3.5–5.2)
Sodium: 137 mmol/L (ref 134–144)
eGFR: 47 mL/min/1.73 — ABNORMAL LOW (ref >59)

## 2020-08-05 LAB — LIPID PANEL
Chol/HDL Ratio: 5.4 ratio — ABNORMAL HIGH (ref 0.0–5.0)
Cholesterol, Total: 158 mg/dL (ref 100–199)
HDL: 29 mg/dL — ABNORMAL LOW (ref >39)
LDL Chol Calc (NIH): 73 mg/dL (ref 0–99)
Triglycerides: 348 mg/dL — ABNORMAL HIGH (ref 0–149)
VLDL Cholesterol Cal: 56 mg/dL — ABNORMAL HIGH (ref 5–40)

## 2020-08-05 NOTE — Telephone Encounter (Signed)
Tristan Sims from [facility] called to report a critical K+ value of 5.8. Of note his creatinine is elevated at 1.58 with GFR of 47.  Per Dr. Acie Fredrickson, pt should discontinue eating K+ rich foods (fruits, potatoes, etc) and stop his lisinopril. I spoke with his wife and she confirmed she will relay these instructions to him. He will come in next week for a BMP redraw and follow up with his primary care doctor for BP management.

## 2020-08-05 NOTE — Telephone Encounter (Signed)
[facility] calling with critical lab results 

## 2020-08-05 NOTE — Patient Instructions (Addendum)
Medication Instructions:  Your physician recommends that you continue on your current medications as directed. Please refer to the Current Medication list given to you today.  Labwork: You will have labs drawn today: Fasting lipid panel, BMP  Testing/Procedures: None ordered.  Follow-Up: Your physician recommends that you schedule a follow-up appointment in:   12 months with Dr. Acie Fredrickson   Any Other Special Instructions Will Be Listed Below (If Applicable).     If you need a refill on your cardiac medications before your next appointment, please call your pharmacy.

## 2020-08-09 ENCOUNTER — Other Ambulatory Visit: Payer: Self-pay

## 2020-08-09 ENCOUNTER — Other Ambulatory Visit: Payer: Medicare Other

## 2020-08-09 DIAGNOSIS — I10 Essential (primary) hypertension: Secondary | ICD-10-CM | POA: Diagnosis not present

## 2020-08-09 DIAGNOSIS — E875 Hyperkalemia: Secondary | ICD-10-CM | POA: Diagnosis not present

## 2020-08-09 LAB — BASIC METABOLIC PANEL
BUN/Creatinine Ratio: 28 — ABNORMAL HIGH (ref 10–24)
BUN: 34 mg/dL — ABNORMAL HIGH (ref 8–27)
CO2: 21 mmol/L (ref 20–29)
Calcium: 9.1 mg/dL (ref 8.6–10.2)
Chloride: 106 mmol/L (ref 96–106)
Creatinine, Ser: 1.23 mg/dL (ref 0.76–1.27)
Glucose: 129 mg/dL — ABNORMAL HIGH (ref 65–99)
Potassium: 4.9 mmol/L (ref 3.5–5.2)
Sodium: 141 mmol/L (ref 134–144)
eGFR: 62 mL/min/{1.73_m2} (ref >59)

## 2020-08-12 DIAGNOSIS — I10 Essential (primary) hypertension: Secondary | ICD-10-CM | POA: Diagnosis not present

## 2020-08-12 DIAGNOSIS — M25512 Pain in left shoulder: Secondary | ICD-10-CM | POA: Diagnosis not present

## 2020-08-12 DIAGNOSIS — E7800 Pure hypercholesterolemia, unspecified: Secondary | ICD-10-CM | POA: Diagnosis not present

## 2020-09-27 ENCOUNTER — Other Ambulatory Visit: Payer: Self-pay

## 2020-09-27 ENCOUNTER — Ambulatory Visit (INDEPENDENT_AMBULATORY_CARE_PROVIDER_SITE_OTHER): Payer: Medicare Other | Admitting: Endocrinology

## 2020-09-27 ENCOUNTER — Encounter: Payer: Self-pay | Admitting: Endocrinology

## 2020-09-27 VITALS — BP 100/60 | HR 70 | Ht 70.0 in | Wt 192.0 lb

## 2020-09-27 DIAGNOSIS — E1122 Type 2 diabetes mellitus with diabetic chronic kidney disease: Secondary | ICD-10-CM

## 2020-09-27 DIAGNOSIS — N1831 Chronic kidney disease, stage 3a: Secondary | ICD-10-CM

## 2020-09-27 DIAGNOSIS — E119 Type 2 diabetes mellitus without complications: Secondary | ICD-10-CM

## 2020-09-27 LAB — POCT GLYCOSYLATED HEMOGLOBIN (HGB A1C): Hemoglobin A1C: 6.7 % — AB (ref 4.0–5.6)

## 2020-09-27 MED ORDER — CONTOUR NEXT TEST VI STRP: 1.0000 | ORAL_STRIP | Freq: Every day | 12 refills | Status: DC

## 2020-09-27 MED ORDER — CONTOUR NEXT ONE DEVI: 1.0000 | Freq: Once | 0 refills | Status: AC

## 2020-09-27 MED ORDER — JARDIANCE 25 MG PO TABS: 25.0000 mg | ORAL_TABLET | Freq: Every day | ORAL | 3 refills | Status: AC

## 2020-09-27 NOTE — Progress Notes (Signed)
Subjective:    Patient ID: Tristan Sims, male    DOB: [birth year], 75 y.o.   MRN: [id number]  HPI pt is referred by Dr Dorthy Cooler, for diabetes.  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   Past Medical History:  Diagnosis Date   Adenomatous polyps    Ascending aortic aneurysm (HCC)    CAD (coronary artery disease)    DM (diabetes mellitus), type 2 (HCC)    HTN (hypertension)    Hypercholesteremia    Tobacco dependence     No past surgical history on file.  Social History   Socioeconomic History   Marital status: Married    Spouse name: Not on file   Number of children: Not on file   Years of education: Not on file   Highest education level: Not on file  Occupational History   Not on file  Tobacco Use   Smoking status: Every Day    Packs/day: 2.00    Years: 53.00    Pack years: 106.00    Types: Cigarettes   Smokeless tobacco: Never   Tobacco comments:    Counseled about need to quit smoking  Substance and Sexual Activity   Alcohol use: Not on file   Drug use: Not on file   Sexual activity: Not on file  Other Topics Concern   Not on file  Social History Narrative   Not on file   Social Determinants of Health   Financial Resource Strain: Not on file  Food Insecurity: Not on file  Transportation Needs: Not on file  Physical Activity: Not on file  Stress: Not on file  Social Connections: Not on file  Intimate Partner Violence: Not on file    Current Outpatient Medications on File Prior to Visit  Medication Sig Dispense Refill   atorvastatin (LIPITOR) 10 MG tablet Take 10 mg by mouth daily.     lisinopril (ZESTRIL) 40 MG tablet Take 40 mg by mouth daily.     metFORMIN (GLUCOPHAGE) 1000 MG tablet Take 1,000 mg by mouth 2 (two) times daily with a meal.     naproxen (NAPROSYN) 500 MG tablet Take  500 mg by mouth 2 (two) times daily with a meal.     No current facility-administered medications on file prior to visit.    Allergies  Allergen Reactions   Aspirin Hives   Penicillins Hives    Family History  Problem Relation Age of Onset   Healthy Mother    Healthy Father    Diabetes Neg Hx     BP 100/60 (BP Location: Right Arm, Patient Position: Sitting, Cuff Size: Normal)   Pulse 70   Ht '5\' 10"'$  (1.778 m)   Wt 192 lb (87.1 kg)   SpO2 97%   BMI 27.55 kg/m    Review of Systems denies weight loss, sob, n/v, memory loss, and depression.      Objective:   Physical Exam Pulses: dorsalis pedis intact bilat.   MSK: no deformity of the feet CV: no leg edema, but there are bilat vv's Skin:  no ulcer on the feet.  normal color and temp on the feet. Neuro: sensation is intact to touch on the feet Ext: there is bilateral onychomycosis of the toenails.    Lab Results  Component Value Date   CREATININE 1.23  08/09/2020   BUN 34 (H) 08/09/2020   NA 141 08/09/2020   K 4.9 08/09/2020   CL 106 08/09/2020   CO2 21 08/09/2020   Lab Results  Component Value Date   HGBA1C 6.7 (A) 09/27/2020    I have reviewed outside records, and summarized: Pt was noted to have elevated A1c, and referred here.  HTN and dyslipidemia were also addressed.      Assessment & Plan:  Type 2 DM: uncontrolled, as goal A1c is the low 6's.  He declines to add another med.   Patient Instructions  good diet and exercise significantly improve the control of your diabetes.  please let me know if you wish to be referred to a dietician.  high blood sugar is very risky to your health.  you should see an eye doctor and dentist every year.  It is very important to get all recommended vaccinations.  Controlling your blood pressure and cholesterol drastically reduces the damage diabetes does to your body.  Those who smoke should quit.  Please discuss these with your doctor.  check your blood sugar once a day.   vary the time of day when you check, between before the 3 meals, and at bedtime.  also check if you have symptoms of your blood sugar being too high or too low.  please keep a record of the readings and bring it to your next appointment here (or you can bring the meter itself).  You can write it on any piece of paper.  please call us sooner if your blood sugar goes below 70, or if most of your readings are over 200. I have sent a prescription to your pharmacy, for a blood sugar meter and strips. Please continue the same 2 diabetes medications. Please come back for a follow-up appointment in 3-4 months.   ch? ?? ?n u?ng t?t v t?p th? d?c c?i thi?n ?ng k? vi?c ki?m sot b?nh ti?u ???ng c?a b?n. xin vui lng cho ti bi?t n?u b?n mu?n ???c gi?i thi?u ??n m?t chuyn gia dinh d??ng. l??ng ???ng trong mu cao c r?t nhi?u nguy c? ??i v?i s?c kh?e c?a b?n. b?n nn ?i khm bc s? nhn khoa v nha s? hng n?m. ?i?u r?t quan tr?ng l ph?i tim phng t?t c? cc lo?i v?c xin ???c khuy?n ngh?. Ki?m sot huy?t p v cholesterol lm gi?m ?ng k? tc h?i c?a b?nh ti?u ???ng ??i v?i c? th? c?a b?n. Nh?ng ng??i ht thu?c nn b? thu?c l. Vui lng th?o lu?n nh?ng ?i?u ny v?i bc s? c?a b?n. ki?m tra l??ng ???ng trong mu c?a b?n m?i ngy m?t l?n. thay ??i th?i gian trong ngy khi b?n ki?m tra, gi?a tr??c 3 b?a ?n v tr??c khi ?i ng?. c?ng ki?m tra xem b?n c cc tri?u ch?ng v? l??ng ???ng trong mu c?a b?n qu cao ho?c qu th?p. vui lng ghi l?i cc k?t qu? ?o v mang n ??n cu?c h?n ti?p theo c?a b?n t?i ?y (ho?c b?n c th? t? mang theo my ?o). B?n c th? vi?t n trn b?t k? m?nh gi?y no. Vui lng g?i cho chng ti s?m h?n n?u l??ng ???ng trong mu c?a b?n d??i 70, ho?c n?u h?u h?t cc k?t qu? ?o c?a b?n trn 200. Ti ? g?i m?t ??n thu?c ??n hi?u thu?c c?a b?n, cho m?t my ?o ???ng huy?t v d?i. Hy ti?p t?c 2 lo?i thu?c ?i?u tr? ti?u ???ng gi?ng nhau. Vui lng ??n h?n  ti khm sau 3-4 thng.

## 2020-09-27 NOTE — Patient Instructions (Addendum)
good diet and exercise significantly improve the control of your diabetes.  please let me know if you wish to be referred to a dietician.  high blood sugar is very risky to your health.  you should see an eye doctor and dentist every year.  It is very important to get all recommended vaccinations.  Controlling your blood pressure and cholesterol drastically reduces the damage diabetes does to your body.  Those who smoke should quit.  Please discuss these with your doctor.  check your blood sugar once a day.  vary the time of day when you check, between before the 3 meals, and at bedtime.  also check if you have symptoms of your blood sugar being too high or too low.  please keep a record of the readings and bring it to your next appointment here (or you can bring the meter itself).  You can write it on any piece of paper.  please call us sooner if your blood sugar goes below 70, or if most of your readings are over 200. I have sent a prescription to your pharmacy, for a blood sugar meter and strips. Please continue the same 2 diabetes medications. Please come back for a follow-up appointment in 3-4 months.   ch? ?? ?n u?ng t?t v t?p th? d?c c?i thi?n ?ng k? vi?c ki?m sot b?nh ti?u ???ng c?a b?n. xin vui lng cho ti bi?t n?u b?n mu?n ???c gi?i thi?u ??n m?t chuyn gia dinh d??ng. l??ng ???ng trong mu cao c r?t nhi?u nguy c? ??i v?i s?c kh?e c?a b?n. b?n nn ?i khm bc s? nhn khoa v nha s? hng n?m. ?i?u r?t quan tr?ng l ph?i tim phng t?t c? cc lo?i v?c xin ???c khuy?n ngh?. Ki?m sot huy?t p v cholesterol lm gi?m ?ng k? tc h?i c?a b?nh ti?u ???ng ??i v?i c? th? c?a b?n. Nh?ng ng??i ht thu?c nn b? thu?c l. Vui lng th?o lu?n nh?ng ?i?u ny v?i bc s? c?a b?n. ki?m tra l??ng ???ng trong mu c?a b?n m?i ngy m?t l?n. thay ??i th?i gian trong ngy khi b?n ki?m tra, gi?a tr??c 3 b?a ?n v tr??c khi ?i ng?. c?ng ki?m tra xem b?n c cc tri?u ch?ng v? l??ng ???ng trong mu c?a b?n qu cao ho?c  qu th?p. vui lng ghi l?i cc k?t qu? ?o v mang n ??n cu?c h?n ti?p theo c?a b?n t?i ?y (ho?c b?n c th? t? mang theo my ?o). B?n c th? vi?t n trn b?t k? m?nh gi?y no. Vui lng g?i cho chng ti s?m h?n n?u l??ng ???ng trong mu c?a b?n d??i 70, ho?c n?u h?u h?t cc k?t qu? ?o c?a b?n trn 200. Ti ? g?i m?t ??n thu?c ??n hi?u thu?c c?a b?n, cho m?t my ?o ???ng huy?t v d?i. Hy ti?p t?c 2 lo?i thu?c ?i?u tr? ti?u ???ng gi?ng nhau. Vui lng ??n h?n ti khm sau 3-4 thng.

## 2020-09-28 DIAGNOSIS — E119 Type 2 diabetes mellitus without complications: Secondary | ICD-10-CM | POA: Insufficient documentation

## 2020-09-28 DIAGNOSIS — I10 Essential (primary) hypertension: Secondary | ICD-10-CM | POA: Diagnosis not present

## 2020-09-28 DIAGNOSIS — E7800 Pure hypercholesterolemia, unspecified: Secondary | ICD-10-CM | POA: Diagnosis not present

## 2020-09-28 DIAGNOSIS — E1169 Type 2 diabetes mellitus with other specified complication: Secondary | ICD-10-CM | POA: Diagnosis not present

## 2020-09-28 DIAGNOSIS — Z79899 Other long term (current) drug therapy: Secondary | ICD-10-CM | POA: Diagnosis not present

## 2020-10-05 ENCOUNTER — Other Ambulatory Visit: Payer: Self-pay | Admitting: *Deleted

## 2020-10-05 DIAGNOSIS — Z87891 Personal history of nicotine dependence: Secondary | ICD-10-CM

## 2020-10-31 ENCOUNTER — Ambulatory Visit
Admission: RE | Admit: 2020-10-31 | Discharge: 2020-10-31 | Disposition: A | Discharge status: Home / Self Care | Payer: Medicare Other | Source: Ambulatory Visit | Attending: Acute Care | Admitting: Acute Care

## 2020-10-31 DIAGNOSIS — J432 Centrilobular emphysema: Secondary | ICD-10-CM | POA: Diagnosis not present

## 2020-10-31 DIAGNOSIS — Z87891 Personal history of nicotine dependence: Secondary | ICD-10-CM

## 2020-10-31 DIAGNOSIS — I2510 Atherosclerotic heart disease of native coronary artery without angina pectoris: Secondary | ICD-10-CM | POA: Diagnosis not present

## 2020-10-31 DIAGNOSIS — J948 Other specified pleural conditions: Secondary | ICD-10-CM | POA: Diagnosis not present

## 2020-11-01 ENCOUNTER — Telehealth: Payer: Self-pay | Admitting: Acute Care

## 2020-11-01 NOTE — Telephone Encounter (Signed)
Received a call report from [facility]:   "IMPRESSION: 1. Lung-RADS 4B, suspicious. Additional imaging evaluation or consultation with Pulmonology or Thoracic Surgery recommended. 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 2. No thoracic adenopathy. 3. Aortic atherosclerosis (ICD10-I70.0), coronary artery atherosclerosis and emphysema (ICD10-J43.9). 4. Hepatic steatosis."  Judson Roch, can you please advise? Thanks!

## 2020-11-03 NOTE — Telephone Encounter (Signed)
Tristan Sims wife is returning phone call. Tristan Sims phone number is [phone].

## 2020-11-03 NOTE — Progress Notes (Signed)
I have attempted to call the patient with the results of his low dose CT Chest. There was no answer on either his mobile or home phone. I have left a HIPPA compliant message with our office contact numbers  requesting the patient call the office for his results.  Langley Gauss, we will await his return call.

## 2020-11-07 ENCOUNTER — Other Ambulatory Visit: Payer: Self-pay | Admitting: Acute Care

## 2020-11-07 ENCOUNTER — Telehealth: Payer: Self-pay | Admitting: *Deleted

## 2020-11-07 DIAGNOSIS — R911 Solitary pulmonary nodule: Secondary | ICD-10-CM

## 2020-11-07 NOTE — Progress Notes (Signed)
I have called the patient and his wife with the results of his low dose CT. I explained that the results were abnormal, and that he has a nodule that is slowly growing, and that we need to take a closer look at this with a PET scan. I also explained that we need to do PFT's to determine if he is a surgical candidate in the event this nodule is malignant. . Both patient and his wife verbalize understanding. They understand they will get a call to schedule today.   Langley Gauss, please fax results to PCP and please schedule PFT. Thanks so much

## 2020-11-07 NOTE — Progress Notes (Signed)
PFT ordered

## 2020-11-07 NOTE — Telephone Encounter (Signed)
Called and spoke.

## 2020-11-07 NOTE — Telephone Encounter (Signed)
Called and spoke with patient's wife, Jeannene Sims, [place], advised that Tristan Lis NP would like for her husband to see Dr. Valeta Harms regarding abnormal results of LD cancer screening.  Patient scheduled to see Dr. Valeta Harms on Wednesday October 26th at 9:30 am, advised to arrive by 9:15 am for check in.  Provided address of the office.  She verbalized understanding.  Nothing further needed.

## 2020-11-21 ENCOUNTER — Ambulatory Visit (HOSPITAL_COMMUNITY)
Admission: RE | Admit: 2020-11-21 | Discharge: 2020-11-21 | Disposition: A | Discharge status: Home / Self Care | Payer: Medicare Other | Source: Ambulatory Visit | Attending: Acute Care | Admitting: Acute Care

## 2020-11-21 DIAGNOSIS — K5730 Diverticulosis of large intestine without perforation or abscess without bleeding: Secondary | ICD-10-CM | POA: Diagnosis not present

## 2020-11-21 DIAGNOSIS — J439 Emphysema, unspecified: Secondary | ICD-10-CM | POA: Diagnosis not present

## 2020-11-21 DIAGNOSIS — R911 Solitary pulmonary nodule: Secondary | ICD-10-CM | POA: Insufficient documentation

## 2020-11-21 DIAGNOSIS — I2510 Atherosclerotic heart disease of native coronary artery without angina pectoris: Secondary | ICD-10-CM | POA: Diagnosis not present

## 2020-11-21 DIAGNOSIS — J432 Centrilobular emphysema: Secondary | ICD-10-CM | POA: Diagnosis not present

## 2020-11-21 DIAGNOSIS — I700 Atherosclerosis of aorta: Secondary | ICD-10-CM | POA: Insufficient documentation

## 2020-11-21 DIAGNOSIS — K760 Fatty (change of) liver, not elsewhere classified: Secondary | ICD-10-CM | POA: Diagnosis not present

## 2020-11-21 LAB — GLUCOSE, CAPILLARY: Glucose-Capillary: 139 mg/dL — ABNORMAL HIGH (ref 70–99)

## 2020-11-21 MED ORDER — FLUDEOXYGLUCOSE F - 18 (FDG) INJECTION
10.0000 | Freq: Once | INTRAVENOUS | Status: AC | PRN
  Administered 2020-11-21: 9.56 via INTRAVENOUS

## 2020-11-22 ENCOUNTER — Other Ambulatory Visit: Payer: Self-pay

## 2020-11-22 ENCOUNTER — Ambulatory Visit (INDEPENDENT_AMBULATORY_CARE_PROVIDER_SITE_OTHER): Payer: Medicare Other | Admitting: Pulmonary Disease

## 2020-11-22 DIAGNOSIS — R911 Solitary pulmonary nodule: Secondary | ICD-10-CM | POA: Diagnosis not present

## 2020-11-22 LAB — PULMONARY FUNCTION TEST
DL/VA % pred: 92 %
DL/VA: 3.68 ml/min/mmHg/L
DLCO cor % pred: 76 %
DLCO cor: 19.39 ml/min/mmHg
DLCO unc % pred: 76 %
DLCO unc: 19.39 ml/min/mmHg
FEF 25-75 Post: 2.47 L/sec
FEF 25-75 Pre: 1.93 L/sec
FEF2575-%Change-Post: 28 %
FEF2575-%Pred-Post: 108 %
FEF2575-%Pred-Pre: 84 %
FEV1-%Change-Post: 6 %
FEV1-%Pred-Post: 83 %
FEV1-%Pred-Pre: 78 %
FEV1-Post: 2.6 L
FEV1-Pre: 2.44 L
FEV1FVC-%Change-Post: 0 %
FEV1FVC-%Pred-Pre: 103 %
FEV6-%Change-Post: 6 %
FEV6-%Pred-Post: 85 %
FEV6-%Pred-Pre: 80 %
FEV6-Post: 3.45 L
FEV6-Pre: 3.23 L
FEV6FVC-%Pred-Post: 106 %
FEV6FVC-%Pred-Pre: 106 %
FVC-%Change-Post: 6 %
FVC-%Pred-Post: 80 %
FVC-%Pred-Pre: 75 %
FVC-Post: 3.45 L
FVC-Pre: 3.23 L
Post FEV1/FVC ratio: 75 %
Post FEV6/FVC ratio: 100 %
Pre FEV1/FVC ratio: 76 %
Pre FEV6/FVC Ratio: 100 %
RV % pred: 85 %
RV: 2.17 L
TLC % pred: 77 %
TLC: 5.43 L

## 2020-11-22 NOTE — Patient Instructions (Signed)
Full PFT performed today. °

## 2020-11-22 NOTE — Progress Notes (Signed)
Full PFT performed today. °

## 2020-11-30 ENCOUNTER — Encounter: Payer: Self-pay | Admitting: Pulmonary Disease

## 2020-11-30 ENCOUNTER — Other Ambulatory Visit: Payer: Self-pay

## 2020-11-30 ENCOUNTER — Ambulatory Visit: Payer: Medicare Other | Admitting: Pulmonary Disease

## 2020-11-30 VITALS — BP 118/68 | HR 71 | Temp 97.8°F | Ht 69.0 in | Wt 195.0 lb

## 2020-11-30 DIAGNOSIS — R911 Solitary pulmonary nodule: Secondary | ICD-10-CM

## 2020-11-30 DIAGNOSIS — J432 Centrilobular emphysema: Secondary | ICD-10-CM

## 2020-11-30 DIAGNOSIS — Z87891 Personal history of nicotine dependence: Secondary | ICD-10-CM | POA: Diagnosis not present

## 2020-11-30 NOTE — Patient Instructions (Signed)
Thank you for visiting Dr. Valeta Harms at [facility]. Today we recommend the following:  Orders Placed This Encounter  Procedures   Ambulatory referral to Cardiothoracic Surgery    Return in about 3 months (around 03/02/2021) for with Eric Form, NP, or Dr. Valeta Harms.    Please do your part to reduce the spread of COVID-19.

## 2020-11-30 NOTE — Progress Notes (Signed)
Synopsis: Referred in Oct 2022 for lung nodule by [name], MD  Subjective:   PATIENT ID: Tristan Sims GENDER: male DOB: 1945-10-13, MRN: [id number]  Chief Complaint  Patient presents with   Consult    Pt. Wants to talk about lung nodule that has grown    This is a 75 year old gentleman, past medical history of diabetes, hypertension, hypercholesterolemia and tobacco use.  He quit smoking in 2019.  Had a 50+ pack year history of smoking.  He is originally from [place].  He is still working in [employer].  He had a lung cancer screening CT that showed a slowly enlarging right lower lobe pulmonary nodule.  This went from a subsolid state to a more solid state concerning for a primary bronchogenic carcinoma.  Patient has had a PET scan complete with no other distant metastasis.  As well as pulmonary function test which revealed normal spirometry, no significant obstruction Ratio of 75, FEV1 of 2.6 L, TLC 77%, DLCO 76% predicted.  Patient from a respiratory standpoint has no significant complaints and is able to complete all of his activities of daily living.  He does have a planned trip coming up at the beginning of December to visit family in [place] for approximately 1 month.   Past Medical History:  Diagnosis Date   Adenomatous polyps    Ascending aortic aneurysm    CAD (coronary artery disease)    DM (diabetes mellitus), type 2 (HCC)    HTN (hypertension)    Hypercholesteremia    Tobacco dependence      Family History  Problem Relation Age of Onset   Healthy Mother    Healthy Father    Diabetes Neg Hx      No past surgical history on file.  Social History   Socioeconomic History   Marital status: Married    Spouse name: Not on file   Number of children: Not on file   Years of education: Not on file   Highest education level: Not on file  Occupational History   Not on file  Tobacco Use   Smoking status: Former    Packs/day: 2.00    Years: 53.00     Pack years: 106.00    Types: Cigarettes   Smokeless tobacco: Never   Tobacco comments:    Counseled about need to quit smoking  Substance and Sexual Activity   Alcohol use: Not on file   Drug use: Not on file   Sexual activity: Not on file  Other Topics Concern   Not on file  Social History Narrative   Not on file   Social Determinants of Health   Financial Resource Strain: Not on file  Food Insecurity: Not on file  Transportation Needs: Not on file  Physical Activity: Not on file  Stress: Not on file  Social Connections: Not on file  Intimate Partner Violence: Not on file     Allergies  Allergen Reactions   Aspirin Hives   Penicillins Hives     Outpatient Medications Prior to Visit  Medication Sig Dispense Refill   atorvastatin (LIPITOR) 10 MG tablet Take 10 mg by mouth daily.     glucose blood (CONTOUR NEXT TEST) test strip 1 each by Other route daily. And lancets 1/day 100 each 12   JARDIANCE 25 MG TABS tablet Take 1 tablet (25 mg total) by mouth daily. 90 tablet 3   lisinopril (ZESTRIL) 40 MG tablet Take 40 mg by mouth daily.  metFORMIN (GLUCOPHAGE) 1000 MG tablet Take 1,000 mg by mouth 2 (two) times daily with a meal.     naproxen (NAPROSYN) 500 MG tablet Take 500 mg by mouth 2 (two) times daily with a meal.     No facility-administered medications prior to visit.    ROS   Objective:  Physical Exam   Vitals:   11/30/20 0923  BP: 118/68  Pulse: 71  Temp: 97.8 F (36.6 C)  TempSrc: Oral  SpO2: 95%  Weight: 195 lb (88.5 kg)  Height: 5\' 9"  (1.753 m)   95% on RA BMI Readings from Last 3 Encounters:  11/30/20 28.80 kg/m  09/27/20 27.55 kg/m  08/05/20 28.53 kg/m   Wt Readings from Last 3 Encounters:  11/30/20 195 lb (88.5 kg)  09/27/20 192 lb (87.1 kg)  08/05/20 193 lb 3.2 oz (87.6 kg)     CBC No results found for: WBC, RBC, HGB, HCT, PLT, MCV, MCH, MCHC, RDW, LYMPHSABS, MONOABS, EOSABS, BASOSABS    Chest Imaging:  10/31/2020  LDCT: Longstanding history of smoking, more solid enlarging right lower lobe pulmonary nodule concerning for malignancy. The patient's images have been independently reviewed by me.     Pulmonary Functions Testing Results: PFT Results Latest Ref Rng & Units 11/22/2020  FVC-Pre L 3.23  FVC-Predicted Pre % 75  FVC-Post L 3.45  FVC-Predicted Post % 80  Pre FEV1/FVC % % 76  Post FEV1/FCV % % 75  FEV1-Pre L 2.44  FEV1-Predicted Pre % 78  FEV1-Post L 2.60  DLCO uncorrected ml/min/mmHg 19.39  DLCO UNC% % 76  DLCO corrected ml/min/mmHg 19.39  DLCO COR %Predicted % 76  DLVA Predicted % 92  TLC L 5.43  TLC % Predicted % 77  RV % Predicted % 85    FeNO:   Pathology:   Echocardiogram:   Heart Catheterization:     Assessment & Plan:     ICD-10-CM   1. Lung nodule  R91.1 Ambulatory referral to Cardiothoracic Surgery    2. Former smoker  Z87.891     3. Centrilobular emphysema ([place])  J43.2       Discussion:  This is a 75 year old gentleman, longstanding history of tobacco use, 100+ pack year history, smoked for 50 years.  Originally from [place].  Quit smoking in 2019.  Pulmonary function test completed today with no significant obstruction, spirometry is relatively normal.  Plan: Discussed with the patient today in the office plans for referral to thoracic surgery versus tissue biopsy and radiation. Due to the fact that he has quit smoking in 2019, good functional status, abnormal PFTs I recommended that he evaluated by thoracic surgery. He is agreeable to this plan. I will send his documentation over to Dr. Kipp Brood for consideration of combo case RAB+RATS versus direct resection. We discussed this today in the office in detail.  As well as reviewed patient's imaging and pulmonary function test.    Current Outpatient Medications:    atorvastatin (LIPITOR) 10 MG tablet, Take 10 mg by mouth daily., Disp: , Rfl:    glucose blood (CONTOUR NEXT TEST) test strip, 1 each by  Other route daily. And lancets 1/day, Disp: 100 each, Rfl: 12   JARDIANCE 25 MG TABS tablet, Take 1 tablet (25 mg total) by mouth daily., Disp: 90 tablet, Rfl: 3   lisinopril (ZESTRIL) 40 MG tablet, Take 40 mg by mouth daily., Disp: , Rfl:    metFORMIN (GLUCOPHAGE) 1000 MG tablet, Take 1,000 mg by mouth 2 (two) times daily with a  meal., Disp: , Rfl:    naproxen (NAPROSYN) 500 MG tablet, Take 500 mg by mouth 2 (two) times daily with a meal., Disp: , Rfl:   I spent 62 minutes dedicated to the care of this patient on the date of this encounter to include pre-visit review of records, face-to-face time with the patient discussing conditions above, post visit ordering of testing, clinical documentation with the electronic health record, making appropriate referrals as documented, and communicating necessary findings to members of the patients care team.   [name], DO [facility] 11/30/2020 10:04 AM

## 2020-12-01 NOTE — Progress Notes (Signed)
Scan reviewed with patient per  Dr. Valeta Harms at [facility] 10/26. PFT's are normal, and no evidence of mets. He has been referred to Thoracic Surgery for evaluation .

## 2020-12-07 DIAGNOSIS — H52223 Regular astigmatism, bilateral: Secondary | ICD-10-CM | POA: Diagnosis not present

## 2020-12-14 NOTE — Progress Notes (Signed)
[address]       ,[place] [address]             [phone]                    Niles Ballard [facility] Medical Record #[id number] Date of Birth: [birth year]  Referring: [name], DO Primary Care: [name], MD Primary Cardiologist: None  Chief Complaint:    Chief Complaint  Patient presents with   Lung Lesion    Surgical consult, Chest CT 10/31/20, PET Scan 11/21/20, PFT's 11/22/20    History of Present Illness:    Tristan Sims 75 y.o. male presents for surgical evaluation of a 9 mm that was found on screening CT.  The patient does have a significant smoking history but quit in 2019.  He denies any neurologic symptoms or weight loss.  He has been having some exertional shortness of breath for the last year.  PET/CT was performed which showed mild uptake, but no distant metastatic disease.       Zubrod Score: At the time of surgery this patient's most appropriate activity status/level should be described as: [x]     0    Normal activity, no symptoms []     1    Restricted in physical strenuous activity but ambulatory, able to do out light work []     2    Ambulatory and capable of self care, unable to do work activities, up and about               >50 % of waking hours                              []     3    Only limited self care, in bed greater than 50% of waking hours []     4    Completely disabled, no self care, confined to bed or chair []     5    Moribund   Past Medical History:  Diagnosis Date   Adenomatous polyps    Ascending aortic aneurysm    CAD (coronary artery disease)    DM (diabetes mellitus), type 2 (HCC)    HTN (hypertension)    Hypercholesteremia    Tobacco dependence     No past surgical history on file.  Family History  Problem Relation Age of Onset   Healthy Mother    Healthy Father    Diabetes Neg Hx      Social History   Tobacco Use  Smoking Status Former   Packs/day: 2.00   Years: 53.00   Pack years:  106.00   Types: Cigarettes  Smokeless Tobacco Never  Tobacco Comments   Counseled about need to quit smoking    Social History   Substance and Sexual Activity  Alcohol Use None     Allergies  Allergen Reactions   Aspirin Hives   Lisinopril Other (See Comments)   Penicillins Hives   Prednisone Other (See Comments)    Current Outpatient Medications  Medication Sig Dispense Refill   atorvastatin (LIPITOR) 10 MG tablet Take 10 mg by mouth daily.     glucose blood (CONTOUR NEXT TEST) test strip 1 each by Other route daily. And lancets 1/day 100 each 12   JARDIANCE 25 MG TABS tablet Take 1 tablet (25 mg total) by mouth daily. 90 tablet 3   lisinopril (ZESTRIL) 40 MG tablet  Take 40 mg by mouth daily.     metFORMIN (GLUCOPHAGE) 1000 MG tablet Take 1,000 mg by mouth 2 (two) times daily with a meal.     naproxen (NAPROSYN) 500 MG tablet Take 500 mg by mouth 2 (two) times daily with a meal.     No current facility-administered medications for this visit.    Review of Systems  Constitutional: Negative.   Respiratory:  Positive for shortness of breath. Negative for cough.   Cardiovascular: Negative.   Neurological: Negative.     PHYSICAL EXAMINATION: BP 122/82   Pulse 80   Resp 20   Ht 5\' 9"  (1.753 m)   Wt 194 lb (88 kg)   SpO2 95% Comment: RA  BMI 28.65 kg/m  Physical Exam Constitutional:      General: He is not in acute distress.    Appearance: Normal appearance. He is normal weight. He is not ill-appearing.  HENT:     Head: Normocephalic and atraumatic.  Eyes:     Extraocular Movements: Extraocular movements intact.  Cardiovascular:     Rate and Rhythm: Normal rate.  Pulmonary:     Effort: Pulmonary effort is normal. No respiratory distress.  Abdominal:     General: There is no distension.  Musculoskeletal:        General: Normal range of motion.     Cervical back: Normal range of motion.  Skin:    General: Skin is warm and dry.  Neurological:     General:  No focal deficit present.     Mental Status: He is alert and oriented to person, place, and time.    Diagnostic Studies & Laboratory data:     Recent Radiology Findings:   NM PET Image Initial (PI) Skull Base To Thigh  Result Date: 11/22/2020 CLINICAL DATA:  Initial treatment strategy for enlarging part solid right lower lobe pulmonary nodule. EXAM: NUCLEAR MEDICINE PET SKULL BASE TO THIGH TECHNIQUE: 9.56 mCi F-18 FDG was injected intravenously. Full-ring PET imaging was performed from the skull base to thigh after the radiotracer. CT data was obtained and used for attenuation correction and anatomic localization. Fasting blood glucose: 139 mg/dl COMPARISON:  CT chest 10/31/2020, 09/14/2019 and 03/16/2019. FINDINGS: Mediastinal blood pool activity: SUV max 1.9 NECK: No hypermetabolic cervical lymph nodes are identified.Symmetric activity within the lymphoid tissue of Waldeyer's ring is within physiologic limits. No other lesions of the pharyngeal mucosal space identified. Incidental CT findings: Bilateral carotid atherosclerosis. CHEST: There are no hypermetabolic mediastinal, hilar or axillary lymph nodes. There is no hypermetabolic activity within the lesion of concern in the right lower lobe which measures approximately 6 mm on image 47/8. No other suspicious pulmonary nodules or hypermetabolic pulmonary activity. Incidental CT findings: Atherosclerosis of the aorta, great vessels and coronary arteries. Mild to moderate centrilobular and paraseptal emphysema. ABDOMEN/PELVIS: There is no hypermetabolic activity within the liver, adrenal glands, spleen or pancreas. There is no hypermetabolic nodal activity. There is focal hypermetabolic activity at the left prostate apex (SUV max 6.4). Incidental CT findings: Diffuse hepatic steatosis. There is 1.6 cm low-density lesion centrally in the right hepatic lobe (image 108/4), likely a cyst. Aortic and branch vessel atherosclerosis with mild dilatation of the  infrarenal aorta to 2.9 cm AP. This is not more than 50% more dilated than the proximal aorta which measures 2.3 cm. No follow-up recommended. This recommendation follows ACR consensus guidelines: White Paper of the ACR Incidental Findings Committee II on Vascular Findings. J Am Coll Radiol 2013; 10:789-794. There  is scattered colonic diverticulosis with nonspecific wall thickening in the rectosigmoid colon. 1.1 cm exophytic lesion from the lower pole of the left kidney (image 137/4) demonstrates thin peripheral calcification and no metabolic activity, likely a mildly complex cyst. The prostate gland is mildly enlarged. SKELETON: There is no hypermetabolic activity to suggest osseous metastatic disease. Incidental CT findings: Mild thoracolumbar spondylosis. IMPRESSION: 1. The slow growing right lower lobe pulmonary nodule demonstrates no hypermetabolic activity. While reassuring, this does not exclude low-grade malignancy such as adenocarcinoma. 2. Focal hypermetabolic activity at the left prostatic apex. Recommend correlation with serum PSA levels and consideration of urology consultation. 3. Incidental findings as above, including hepatic steatosis, probable hepatic and left renal cysts, coronary andAortic Atherosclerosis (ICD10-I70.0) and Emphysema (ICD10-J43.9). Electronically Signed   By: Richardean Sale M.D.   On: 11/22/2020 15:27       I have independently reviewed the above radiology studies  and reviewed the findings with the patient.   Recent Lab Findings: Lab Results  Component Value Date   GLUCOSE 129 (H) 08/09/2020   CHOL 158 08/05/2020   TRIG 348 (H) 08/05/2020   HDL 29 (L) 08/05/2020   LDLCALC 73 08/05/2020   ALT 70 (H) 06/16/2019   AST 48 (H) 06/16/2019   NA 141 08/09/2020   K 4.9 08/09/2020   CL 106 08/09/2020   CREATININE 1.23 08/09/2020   BUN 34 (H) 08/09/2020   CO2 21 08/09/2020   HGBA1C 6.7 (A) 09/27/2020     PFTs: - FVC: 75% - FEV1: 78% -DLCO: 76%  Problem  List: 9.2 mm right lower lobe pulmonary nodule.  Minimal uptake on PET/CT.   Assessment / Plan:   75 year old male with 9.2 mm right lower lobe pulmonary nodule.  Patient does have a significant history of smoking but quit in 2019.  Patient would be a candidate for combination procedure.  He is scheduled to go to [place] after Thanksgiving and will not be back until after Christmas.  I will see him back in January prior to his surgical date.     I  spent 40 minutes with  the patient face to face in counseling and coordination of care.    Lajuana Matte 12/16/2020 4:26 PM

## 2020-12-16 ENCOUNTER — Other Ambulatory Visit: Payer: Self-pay | Admitting: *Deleted

## 2020-12-16 ENCOUNTER — Institutional Professional Consult (permissible substitution): Payer: Medicare Other | Admitting: Thoracic Surgery (Cardiothoracic Vascular Surgery)

## 2020-12-16 ENCOUNTER — Other Ambulatory Visit: Payer: Self-pay

## 2020-12-16 ENCOUNTER — Encounter: Payer: Self-pay | Admitting: *Deleted

## 2020-12-16 VITALS — BP 122/82 | HR 80 | Resp 20 | Ht 69.0 in | Wt 194.0 lb

## 2020-12-16 DIAGNOSIS — R911 Solitary pulmonary nodule: Secondary | ICD-10-CM | POA: Diagnosis present

## 2020-12-16 DIAGNOSIS — I2510 Atherosclerotic heart disease of native coronary artery without angina pectoris: Secondary | ICD-10-CM | POA: Insufficient documentation

## 2020-12-16 DIAGNOSIS — F17200 Nicotine dependence, unspecified, uncomplicated: Secondary | ICD-10-CM | POA: Insufficient documentation

## 2020-12-16 DIAGNOSIS — I77810 Thoracic aortic ectasia: Secondary | ICD-10-CM | POA: Insufficient documentation

## 2020-12-16 DIAGNOSIS — K760 Fatty (change of) liver, not elsewhere classified: Secondary | ICD-10-CM | POA: Insufficient documentation

## 2020-12-16 DIAGNOSIS — N481 Balanitis: Secondary | ICD-10-CM | POA: Insufficient documentation

## 2020-12-16 DIAGNOSIS — E7800 Pure hypercholesterolemia, unspecified: Secondary | ICD-10-CM | POA: Insufficient documentation

## 2020-12-16 DIAGNOSIS — Z01818 Encounter for other preprocedural examination: Secondary | ICD-10-CM

## 2020-12-17 ENCOUNTER — Telehealth: Payer: Self-pay | Admitting: Pulmonary Disease

## 2020-12-17 DIAGNOSIS — R911 Solitary pulmonary nodule: Secondary | ICD-10-CM

## 2020-12-17 NOTE — Telephone Encounter (Signed)
PCCM:  Orders placed 02/24/2020 for RAB+RATS  [name], DO [facility] 12/17/2020 12:41 PM

## 2020-12-19 ENCOUNTER — Other Ambulatory Visit: Payer: Self-pay | Admitting: *Deleted

## 2020-12-19 DIAGNOSIS — Z01818 Encounter for other preprocedural examination: Secondary | ICD-10-CM

## 2020-12-19 DIAGNOSIS — R911 Solitary pulmonary nodule: Secondary | ICD-10-CM

## 2020-12-20 ENCOUNTER — Encounter: Payer: Self-pay | Admitting: Pulmonary Disease

## 2020-12-20 ENCOUNTER — Other Ambulatory Visit: Payer: Self-pay | Admitting: Thoracic Surgery (Cardiothoracic Vascular Surgery)

## 2020-12-20 DIAGNOSIS — R911 Solitary pulmonary nodule: Secondary | ICD-10-CM

## 2021-01-20 ENCOUNTER — Encounter: Payer: Self-pay | Admitting: *Deleted

## 2021-01-24 ENCOUNTER — Telehealth (HOSPITAL_COMMUNITY): Payer: Self-pay | Admitting: *Deleted

## 2021-01-24 NOTE — Telephone Encounter (Signed)
Left message on voicemail per DPR in reference to upcoming appointment scheduled on 01/31/21 at 0730 with detailed instructions given per Myocardial Perfusion Study Information Sheet for the test. LM to arrive 15 minutes early, and that it is imperative to arrive on time for appointment to keep from having the test rescheduled. If you need to cancel or reschedule your appointment, please call the office within 24 hours of your appointment. Failure to do so may result in a cancellation of your appointment, and a $50 no show fee. Phone number given for call back for any questions. Emilia Kayes, Ranae Palms

## 2021-01-31 ENCOUNTER — Ambulatory Visit (HOSPITAL_COMMUNITY): Payer: Medicare Other | Attending: Cardiovascular Disease

## 2021-01-31 ENCOUNTER — Other Ambulatory Visit: Payer: Self-pay

## 2021-01-31 DIAGNOSIS — Z7984 Long term (current) use of oral hypoglycemic drugs: Secondary | ICD-10-CM | POA: Insufficient documentation

## 2021-01-31 DIAGNOSIS — I10 Essential (primary) hypertension: Secondary | ICD-10-CM | POA: Diagnosis not present

## 2021-01-31 DIAGNOSIS — Z01818 Encounter for other preprocedural examination: Secondary | ICD-10-CM | POA: Insufficient documentation

## 2021-01-31 DIAGNOSIS — Z01810 Encounter for preprocedural cardiovascular examination: Secondary | ICD-10-CM

## 2021-01-31 DIAGNOSIS — E119 Type 2 diabetes mellitus without complications: Secondary | ICD-10-CM | POA: Insufficient documentation

## 2021-01-31 DIAGNOSIS — R0609 Other forms of dyspnea: Secondary | ICD-10-CM | POA: Diagnosis not present

## 2021-01-31 LAB — MYOCARDIAL PERFUSION IMAGING
LV dias vol: 79 mL (ref 62–150)
LV sys vol: 40 mL
Nuc Stress EF: 49 %
Peak HR: 92 {beats}/min
Rest HR: 76 {beats}/min
Rest Nuclear Isotope Dose: 10.5 mCi
SDS: 4
SRS: 0
SSS: 4
ST Depression (mm): 0 mm
Stress Nuclear Isotope Dose: 30.4 mCi
TID: 1.17

## 2021-01-31 MED ORDER — REGADENOSON 0.4 MG/5ML IV SOLN
0.4000 mg | Freq: Once | INTRAVENOUS | Status: AC
  Administered 2021-01-31: 0.4 mg via INTRAVENOUS

## 2021-01-31 MED ORDER — TECHNETIUM TC 99M TETROFOSMIN IV KIT
10.5000 | PACK | Freq: Once | INTRAVENOUS | Status: AC | PRN
  Administered 2021-01-31: 07:00:00 10.5 via INTRAVENOUS
  Filled 2021-01-31: qty 11

## 2021-01-31 MED ORDER — TECHNETIUM TC 99M TETROFOSMIN IV KIT
30.4000 | PACK | Freq: Once | INTRAVENOUS | Status: AC | PRN
  Administered 2021-01-31: 30.4 via INTRAVENOUS
  Filled 2021-01-31: qty 31

## 2021-02-10 ENCOUNTER — Ambulatory Visit: Payer: Medicare Other | Admitting: Thoracic Surgery (Cardiothoracic Vascular Surgery)

## 2021-02-10 ENCOUNTER — Telehealth: Payer: Medicare Other | Admitting: Thoracic Surgery (Cardiothoracic Vascular Surgery)

## 2021-02-13 ENCOUNTER — Other Ambulatory Visit: Payer: Medicare Other

## 2021-02-13 ENCOUNTER — Other Ambulatory Visit (HOSPITAL_COMMUNITY): Payer: Medicare Other

## 2021-02-14 NOTE — Progress Notes (Signed)
Surgical Instructions    Your procedure is scheduled on Thursday, January 12th, 2023.   Report to [facility] Main Entrance "A" at 05:30 A.M., then check in with the Admitting office.  Call this number if you have problems the morning of surgery:  [phone]   If you have any questions prior to your surgery date call [phone]: Open Monday-Friday 8am-4pm    Remember:  Do not eat or drink after midnight the night before your surgery    Take these medicines the morning of surgery with A SIP OF WATER:   atorvastatin (LIPITOR)    As of today, STOP taking any Aspirin (unless otherwise instructed by your surgeon) Aleve, Naproxen, Ibuprofen, Motrin, Advil, Goody's, BC's, all herbal medications, fish oil, and all vitamins.   WHAT DO I DO ABOUT MY DIABETES MEDICATION?   Do not take metFORMIN (GLUCOPHAGE) the morning of surgery.  Do not take JARDIANCE the day before surgery and the day of surgery   HOW TO MANAGE YOUR DIABETES BEFORE AND AFTER SURGERY  Why is it important to control my blood sugar before and after surgery? Improving blood sugar levels before and after surgery helps healing and can limit problems. A way of improving blood sugar control is eating a healthy diet by:  Eating less sugar and carbohydrates  Increasing activity/exercise  Talking with your doctor about reaching your blood sugar goals High blood sugars (greater than 180 mg/dL) can raise your risk of infections and slow your recovery, so you will need to focus on controlling your diabetes during the weeks before surgery. Make sure that the doctor who takes care of your diabetes knows about your planned surgery including the date and location.  How do I manage my blood sugar before surgery? Check your blood sugar at least 4 times a day, starting 2 days before surgery, to make sure that the level is not too high or low.  Check your blood sugar the morning of your surgery when you wake up and every 2 hours  until you get to the Short Stay unit.  If your blood sugar is less than 70 mg/dL, you will need to treat for low blood sugar: Do not take insulin. Treat a low blood sugar (less than 70 mg/dL) with  cup of clear juice (cranberry or apple), 4 glucose tablets, OR glucose gel. Recheck blood sugar in 15 minutes after treatment (to make sure it is greater than 70 mg/dL). If your blood sugar is not greater than 70 mg/dL on recheck, call [phone] for further instructions. Report your blood sugar to the short stay nurse when you get to Short Stay.  If you are admitted to the hospital after surgery: Your blood sugar will be checked by the staff and you will probably be given insulin after surgery (instead of oral diabetes medicines) to make sure you have good blood sugar levels. The goal for blood sugar control after surgery is 80-180 mg/dL.      After your COVID test   You are not required to quarantine however you are required to wear a well-fitting mask when you are out and around people not in your household.  If your mask becomes wet or soiled, replace with a new one.  Wash your hands often with soap and water for 20 seconds or clean your hands with an alcohol-based hand sanitizer that contains at least 60% alcohol.  Do not share personal items.  Notify your provider: if you are in close contact with someone who  has COVID  or if you develop a fever of 100.4 or greater, sneezing, cough, sore throat, shortness of breath or body aches.    The day of surgery:  Do not wear jewelry  Do not wear lotions, powders, colognes, or deodorant. Men may shave face and neck. Do not bring valuables to the hospital.              [facility] is not responsible for any belongings or valuables.  Do NOT Smoke (Tobacco/Vaping)  24 hours prior to your procedure  If you use a CPAP at night, you may bring your mask for your overnight stay.   Contacts, glasses, hearing aids, dentures or partials may  not be worn into surgery, please bring cases for these belongings   For patients admitted to the hospital, discharge time will be determined by your treatment team.   Patients discharged the day of surgery will not be allowed to drive home, and someone needs to stay with them for 24 hours.  NO VISITORS WILL BE ALLOWED IN PRE-OP WHERE PATIENTS ARE PREPPED FOR SURGERY.  ONLY 1 SUPPORT PERSON MAY BE PRESENT IN THE WAITING ROOM WHILE YOU ARE IN SURGERY.  IF YOU ARE TO BE ADMITTED, ONCE YOU ARE IN YOUR ROOM YOU WILL BE ALLOWED TWO (2) VISITORS. 1 (ONE) VISITOR MAY STAY OVERNIGHT BUT MUST ARRIVE TO THE ROOM BY 8pm.  Minor children may have two parents present. Special consideration for safety and communication needs will be reviewed on a case by case basis.  Special instructions:    Oral Hygiene is also important to reduce your risk of infection.  Remember - BRUSH YOUR TEETH THE MORNING OF SURGERY WITH YOUR REGULAR TOOTHPASTE   - Preparing For Surgery  Before surgery, you can play an important role. Because skin is not sterile, your skin needs to be as free of germs as possible. You can reduce the number of germs on your skin by washing with CHG (chlorahexidine gluconate) Soap before surgery.  CHG is an antiseptic cleaner which kills germs and bonds with the skin to continue killing germs even after washing.     Please do not use if you have an allergy to CHG or antibacterial soaps. If your skin becomes reddened/irritated stop using the CHG.  Do not shave (including legs and underarms) for at least 48 hours prior to first CHG shower. It is OK to shave your face.  Please follow these instructions carefully.     Shower the NIGHT BEFORE SURGERY and the MORNING OF SURGERY with CHG Soap.   If you chose to wash your hair, wash your hair first as usual with your normal shampoo. After you shampoo, rinse your hair and body thoroughly to remove the shampoo.  Then [employer] and genitals (private  parts) with your normal soap and rinse thoroughly to remove soap.  After that Use CHG Soap as you would any other liquid soap. You can apply CHG directly to the skin and wash gently with a scrungie or a clean washcloth.   Apply the CHG Soap to your body ONLY FROM THE NECK DOWN.  Do not use on open wounds or open sores. Avoid contact with your eyes, ears, mouth and genitals (private parts). Wash Face and genitals (private parts)  with your normal soap.   Wash thoroughly, paying special attention to the area where your surgery will be performed.  Thoroughly rinse your body with warm water from the neck down.  DO NOT shower/wash  with your normal soap after using and rinsing off the CHG Soap.  Pat yourself dry with a CLEAN TOWEL.  Wear CLEAN PAJAMAS to bed the night before surgery  Place CLEAN SHEETS on your bed the night before your surgery  DO NOT SLEEP WITH PETS.   Day of Surgery:  Take a shower with CHG soap. Wear Clean/Comfortable clothing the morning of surgery Do not apply any deodorants/lotions.   Remember to brush your teeth WITH YOUR REGULAR TOOTHPASTE.   Please read over the following fact sheets that you were given.

## 2021-02-15 ENCOUNTER — Other Ambulatory Visit: Payer: Self-pay

## 2021-02-15 ENCOUNTER — Encounter (HOSPITAL_COMMUNITY)
Admission: RE | Admit: 2021-02-15 | Discharge: 2021-02-15 | Disposition: A | Discharge status: Home / Self Care | Payer: Medicare Other | Source: Ambulatory Visit | Attending: Pulmonary Disease | Admitting: Pulmonary Disease

## 2021-02-15 ENCOUNTER — Ambulatory Visit (HOSPITAL_COMMUNITY)
Admission: RE | Admit: 2021-02-15 | Discharge: 2021-02-15 | Disposition: A | Discharge status: Home / Self Care | Payer: Medicare Other | Source: Ambulatory Visit | Attending: Thoracic Surgery (Cardiothoracic Vascular Surgery) | Admitting: Thoracic Surgery (Cardiothoracic Vascular Surgery)

## 2021-02-15 ENCOUNTER — Ambulatory Visit (INDEPENDENT_AMBULATORY_CARE_PROVIDER_SITE_OTHER)
Admission: RE | Admit: 2021-02-15 | Discharge: 2021-02-15 | Disposition: A | Discharge status: Home / Self Care | Payer: Medicare Other | Source: Ambulatory Visit | Attending: Pulmonary Disease | Admitting: Pulmonary Disease

## 2021-02-15 ENCOUNTER — Telehealth: Payer: Self-pay | Admitting: *Deleted

## 2021-02-15 ENCOUNTER — Telehealth: Payer: Self-pay | Admitting: Pulmonary Disease

## 2021-02-15 ENCOUNTER — Encounter (HOSPITAL_COMMUNITY): Payer: Self-pay

## 2021-02-15 VITALS — BP 121/87 | HR 71 | Temp 97.8°F | Resp 18 | Ht 69.0 in | Wt 189.9 lb

## 2021-02-15 DIAGNOSIS — Z01818 Encounter for other preprocedural examination: Secondary | ICD-10-CM | POA: Insufficient documentation

## 2021-02-15 DIAGNOSIS — I700 Atherosclerosis of aorta: Secondary | ICD-10-CM | POA: Diagnosis not present

## 2021-02-15 DIAGNOSIS — E1122 Type 2 diabetes mellitus with diabetic chronic kidney disease: Secondary | ICD-10-CM | POA: Diagnosis not present

## 2021-02-15 DIAGNOSIS — R911 Solitary pulmonary nodule: Secondary | ICD-10-CM | POA: Diagnosis not present

## 2021-02-15 DIAGNOSIS — J439 Emphysema, unspecified: Secondary | ICD-10-CM | POA: Diagnosis not present

## 2021-02-15 DIAGNOSIS — N1831 Chronic kidney disease, stage 3a: Secondary | ICD-10-CM | POA: Diagnosis not present

## 2021-02-15 DIAGNOSIS — J189 Pneumonia, unspecified organism: Secondary | ICD-10-CM | POA: Diagnosis not present

## 2021-02-15 LAB — BLOOD GAS, ARTERIAL
Acid-base deficit: 1.4 mmol/L (ref 0.0–2.0)
Bicarbonate: 22.8 mmol/L (ref 20.0–28.0)
Drawn by: 58793
FIO2: 21
O2 Saturation: 93.5 %
Patient temperature: 37
pCO2 arterial: 37.5 mmHg (ref 32.0–48.0)
pH, Arterial: 7.4 (ref 7.350–7.450)
pO2, Arterial: 73.5 mmHg — ABNORMAL LOW (ref 83.0–108.0)

## 2021-02-15 LAB — URINALYSIS, MICROSCOPIC (REFLEX)

## 2021-02-15 LAB — URINALYSIS, ROUTINE W REFLEX MICROSCOPIC
Bilirubin Urine: NEGATIVE
Glucose, UA: >=500 mg/dL — AB
Ketones, ur: NEGATIVE mg/dL
Leukocytes,Ua: NEGATIVE
Nitrite: NEGATIVE
Protein, ur: NEGATIVE mg/dL
Specific Gravity, Urine: 1.025 (ref 1.005–1.030)
pH: 5 (ref 5.0–8.0)

## 2021-02-15 LAB — COMPREHENSIVE METABOLIC PANEL
ALT: 41 U/L (ref 0–44)
AST: 33 U/L (ref 15–41)
Albumin: 3.7 g/dL (ref 3.5–5.0)
Alkaline Phosphatase: 43 U/L (ref 38–126)
Anion gap: 10 (ref 5–15)
BUN: 15 mg/dL (ref 8–23)
CO2: 20 mmol/L — ABNORMAL LOW (ref 22–32)
Calcium: 9.2 mg/dL (ref 8.9–10.3)
Chloride: 108 mmol/L (ref 98–111)
Creatinine, Ser: 0.94 mg/dL (ref 0.61–1.24)
GFR, Estimated: >60 mL/min (ref >60)
Glucose, Bld: 138 mg/dL — ABNORMAL HIGH (ref 70–99)
Potassium: 3.9 mmol/L (ref 3.5–5.1)
Sodium: 138 mmol/L (ref 135–145)
Total Bilirubin: 1.4 mg/dL — ABNORMAL HIGH (ref 0.3–1.2)
Total Protein: 7.3 g/dL (ref 6.5–8.1)

## 2021-02-15 LAB — HEMOGLOBIN A1C
Hgb A1c MFr Bld: 8.2 % — ABNORMAL HIGH (ref 4.8–5.6)
Mean Plasma Glucose: 188.64 mg/dL

## 2021-02-15 LAB — GLUCOSE, CAPILLARY: Glucose-Capillary: 150 mg/dL — ABNORMAL HIGH (ref 70–99)

## 2021-02-15 LAB — CBC
HCT: 44.1 % (ref 39.0–52.0)
Hemoglobin: 14.6 g/dL (ref 13.0–17.0)
MCH: 31.2 pg (ref 26.0–34.0)
MCHC: 33.1 g/dL (ref 30.0–36.0)
MCV: 94.2 fL (ref 80.0–100.0)
Platelets: 245 10*3/uL (ref 150–400)
RBC: 4.68 MIL/uL (ref 4.22–5.81)
RDW: 12.6 % (ref 11.5–15.5)
WBC: 6.2 10*3/uL (ref 4.0–10.5)
nRBC: 0 % (ref 0.0–0.2)

## 2021-02-15 LAB — TYPE AND SCREEN
ABO/RH(D): A POS
Antibody Screen: NEGATIVE

## 2021-02-15 LAB — PROTIME-INR
INR: 1 (ref 0.8–1.2)
Prothrombin Time: 13 seconds (ref 11.4–15.2)

## 2021-02-15 LAB — SURGICAL PCR SCREEN
MRSA, PCR: NEGATIVE
Staphylococcus aureus: NEGATIVE

## 2021-02-15 LAB — APTT: aPTT: 32 seconds (ref 24–36)

## 2021-02-15 NOTE — Telephone Encounter (Signed)
Patient called last week to tell me he had a + COVID home test on 02/05/21.  Patient was having minimal symptoms and was told to call me if something changed.  We were able to keep surgery as scheduled on 02/16/21 but I moved his pre-op appt and CT appt to get him though the 10 days post + test.  Patient and wife understand and have no further questions.

## 2021-02-15 NOTE — Telephone Encounter (Signed)
Discussed with Dr. Valeta Harms, okay to proceed with bronchoscopy.

## 2021-02-15 NOTE — Telephone Encounter (Signed)
Can we call and see if he is having any symptoms of covid or other respiratory illness - cough, fever, runny nose, sore throat, etc?

## 2021-02-15 NOTE — Telephone Encounter (Signed)
Received call from Opal Sidles with [facility]- Super D Chest CT done 02/15/21   The report not uploaded quite yet   She states that it's significant for development of airspace opacity and ground glass opacities suspicious of atypical infection- COVID 19 may have this appearance.   Per Opal Sidles- radiologist wanted this called to [place] STAT since the pt is scheduled for bronch tomorrow with Dr Valeta Harms in the morning.  Dr Silas Flood, please advise if you feel this needs to be addressed today or if this can wait for Dr Valeta Harms to review at procedure tomorrow. Thanks!

## 2021-02-15 NOTE — Anesthesia Preprocedure Evaluation (Addendum)
Anesthesia Evaluation  Patient identified by MRN, date of birth, ID band Patient awake    Reviewed: Allergy & Precautions, NPO status , Patient's Chart, lab work & pertinent test results  Airway Mallampati: II  TM Distance: >3 FB Neck ROM: Full    Dental  (+) Edentulous Upper, Edentulous Lower   Pulmonary former smoker,  pulm nodule    Pulmonary exam normal        Cardiovascular hypertension, + CAD and + Peripheral Vascular Disease   Rhythm:Regular Rate:Normal     Neuro/Psych negative neurological ROS  negative psych ROS   GI/Hepatic negative GI ROS, Neg liver ROS,   Endo/Other  diabetes, Type 2, Oral Hypoglycemic Agents  Renal/GU negative Renal ROS  negative genitourinary   Musculoskeletal negative musculoskeletal ROS (+)   Abdominal Normal abdominal exam  (+)   Peds  Hematology negative hematology ROS (+)   Anesthesia Other Findings   Reproductive/Obstetrics                            Anesthesia Physical Anesthesia Plan  ASA: 3  Anesthesia Plan: General   Post-op Pain Management:    Induction: Intravenous  PONV Risk Score and Plan: 2 and Ondansetron, Dexamethasone and Treatment may vary due to age or medical condition  Airway Management Planned: Mask and Double Lumen EBT  Additional Equipment: Arterial line  Intra-op Plan:   Post-operative Plan: Extubation in OR  Informed Consent: I have reviewed the patients History and Physical, chart, labs and discussed the procedure including the risks, benefits and alternatives for the proposed anesthesia with the patient or authorized representative who has indicated his/her understanding and acceptance.     Dental advisory given  Plan Discussed with: CRNA  Anesthesia Plan Comments: (Lab Results      Component                Value               Date                      WBC                      6.2                 02/15/2021                 HGB                      14.6                02/15/2021                HCT                      44.1                02/15/2021                MCV                      94.2                02/15/2021                PLT  245                 02/15/2021           Lab Results      Component                Value               Date                      NA                       138                 02/15/2021                K                        3.9                 02/15/2021                CO2                      20 (L)              02/15/2021                GLUCOSE                  138 (H)             02/15/2021                BUN                      15                  02/15/2021                CREATININE               0.94                02/15/2021                CALCIUM                  9.2                 02/15/2021                EGFR                     62                  08/09/2020            Perfusion 12/22:   The study is normal. The study is low risk.   No ST deviation was noted.   LV perfusion is normal. There is no evidence of ischemia. There is no evidence of infarction.   Left ventricular function is normal. Nuclear stress EF: 49 %. The left ventricular ejection fraction is mildly decreased (45-54%). End diastolic cavity size is normal.   Prior study not available for comparison.  1. Reduced apical counts on rest/stress imaging with normal wall motion consistent with apical thinning artifact.  2. Normal  study without evidence of ischemia or infarction.  3. LVEF is normal for this modality, 49%.  4. This is a low-risk study.         GFRNONAA                 >60                 02/15/2021          )       Anesthesia Quick Evaluation

## 2021-02-15 NOTE — Progress Notes (Addendum)
PCP - Dr. [name] Cardiologist - Denies  Chest x-ray - 02/15/21 EKG - 02/15/21 Stress Test - 01/31/21  Sleep Study - Denies  DM - Type II Fasting Blood Sugar - at PAT appt 150 Checks Blood Sugar _does not check  COVID TEST- Not done. Spoke to [employer] and received instruction to not test. Patient tested at home on 02/05/21 and found to be positive. Asymptomatic today 02/15/21   Anesthesia review: No  Patient denies shortness of breath, fever, cough and chest pain at PAT appointment   All instructions explained to the patient, with a verbal understanding of the material. Patient agrees to go over the instructions while at home for a better understanding. Patient also instructed to wear a mask while in public after being tested for COVID-19. The opportunity to ask questions was provided.

## 2021-02-16 ENCOUNTER — Ambulatory Visit (HOSPITAL_COMMUNITY): Payer: Medicare Other | Admitting: Anesthesiology

## 2021-02-16 ENCOUNTER — Encounter (HOSPITAL_COMMUNITY)
Admission: RE | Disposition: A | Discharge status: Home / Self Care | Payer: Self-pay | Source: Home / Self Care | Attending: Pulmonary Disease

## 2021-02-16 ENCOUNTER — Ambulatory Visit (HOSPITAL_COMMUNITY)
Admission: RE | Admit: 2021-02-16 | Discharge: 2021-02-16 | Disposition: A | Discharge status: Home / Self Care | Payer: Medicare Other | Attending: Pulmonary Disease | Admitting: Pulmonary Disease

## 2021-02-16 ENCOUNTER — Ambulatory Visit (HOSPITAL_COMMUNITY): Payer: Medicare Other

## 2021-02-16 ENCOUNTER — Other Ambulatory Visit: Payer: Self-pay | Admitting: *Deleted

## 2021-02-16 DIAGNOSIS — I700 Atherosclerosis of aorta: Secondary | ICD-10-CM | POA: Insufficient documentation

## 2021-02-16 DIAGNOSIS — I6523 Occlusion and stenosis of bilateral carotid arteries: Secondary | ICD-10-CM | POA: Insufficient documentation

## 2021-02-16 DIAGNOSIS — Z87891 Personal history of nicotine dependence: Secondary | ICD-10-CM | POA: Diagnosis not present

## 2021-02-16 DIAGNOSIS — I2510 Atherosclerotic heart disease of native coronary artery without angina pectoris: Secondary | ICD-10-CM | POA: Insufficient documentation

## 2021-02-16 DIAGNOSIS — Z7984 Long term (current) use of oral hypoglycemic drugs: Secondary | ICD-10-CM | POA: Diagnosis not present

## 2021-02-16 DIAGNOSIS — Z419 Encounter for procedure for purposes other than remedying health state, unspecified: Secondary | ICD-10-CM

## 2021-02-16 DIAGNOSIS — R918 Other nonspecific abnormal finding of lung field: Secondary | ICD-10-CM | POA: Diagnosis not present

## 2021-02-16 DIAGNOSIS — R911 Solitary pulmonary nodule: Secondary | ICD-10-CM | POA: Diagnosis not present

## 2021-02-16 DIAGNOSIS — J432 Centrilobular emphysema: Secondary | ICD-10-CM | POA: Insufficient documentation

## 2021-02-16 DIAGNOSIS — J9811 Atelectasis: Secondary | ICD-10-CM | POA: Insufficient documentation

## 2021-02-16 DIAGNOSIS — E119 Type 2 diabetes mellitus without complications: Secondary | ICD-10-CM | POA: Insufficient documentation

## 2021-02-16 DIAGNOSIS — I10 Essential (primary) hypertension: Secondary | ICD-10-CM | POA: Diagnosis not present

## 2021-02-16 DIAGNOSIS — Z8616 Personal history of COVID-19: Secondary | ICD-10-CM | POA: Diagnosis not present

## 2021-02-16 DIAGNOSIS — Z9889 Other specified postprocedural states: Secondary | ICD-10-CM

## 2021-02-16 HISTORY — PX: VIDEO BRONCHOSCOPY WITH ENDOBRONCHIAL NAVIGATION: SHX6175

## 2021-02-16 LAB — GLUCOSE, CAPILLARY
Glucose-Capillary: 145 mg/dL — ABNORMAL HIGH (ref 70–99)
Glucose-Capillary: 164 mg/dL — ABNORMAL HIGH (ref 70–99)

## 2021-02-16 LAB — ABO/RH: ABO/RH(D): A POS

## 2021-02-16 SURGERY — VIDEO BRONCHOSCOPY WITH ENDOBRONCHIAL NAVIGATION
Anesthesia: General

## 2021-02-16 SURGERY — CANCELLED PROCEDURE

## 2021-02-16 MED ORDER — CHLORHEXIDINE GLUCONATE 0.12 % MT SOLN
15.0000 mL | Freq: Once | OROMUCOSAL | Status: AC
  Administered 2021-02-16: 15 mL via OROMUCOSAL
  Filled 2021-02-16 (×2): qty 15

## 2021-02-16 MED ORDER — LIDOCAINE 2% (20 MG/ML) 5 ML SYRINGE
INTRAMUSCULAR | Status: DC | PRN
Start: 2021-02-16 — End: 2021-02-16
  Administered 2021-02-16: 80 mg via INTRAVENOUS

## 2021-02-16 MED ORDER — PHENYLEPHRINE 40 MCG/ML (10ML) SYRINGE FOR IV PUSH (FOR BLOOD PRESSURE SUPPORT)
PREFILLED_SYRINGE | INTRAVENOUS | Status: AC
  Filled 2021-02-16: qty 10

## 2021-02-16 MED ORDER — PHENYLEPHRINE HCL-NACL 20-0.9 MG/250ML-% IV SOLN
INTRAVENOUS | Status: DC | PRN
  Administered 2021-02-16: 50 ug/min via INTRAVENOUS
  Administered 2021-02-16: 100 ug/min via INTRAVENOUS

## 2021-02-16 MED ORDER — DEXAMETHASONE SODIUM PHOSPHATE 10 MG/ML IJ SOLN
INTRAMUSCULAR | Status: AC
  Filled 2021-02-16: qty 1

## 2021-02-16 MED ORDER — ACETAMINOPHEN 10 MG/ML IV SOLN: 1000.0000 mg | Freq: Once | INTRAVENOUS | Status: DC | PRN

## 2021-02-16 MED ORDER — MIDAZOLAM HCL 2 MG/2ML IJ SOLN
INTRAMUSCULAR | Status: DC | PRN
  Administered 2021-02-16: 2 mg via INTRAVENOUS

## 2021-02-16 MED ORDER — LIDOCAINE 2% (20 MG/ML) 5 ML SYRINGE
INTRAMUSCULAR | Status: AC
  Filled 2021-02-16: qty 5

## 2021-02-16 MED ORDER — ORAL CARE MOUTH RINSE: 15.0000 mL | Freq: Once | OROMUCOSAL | Status: AC

## 2021-02-16 MED ORDER — FENTANYL CITRATE (PF) 100 MCG/2ML IJ SOLN: 25.0000 ug | INTRAMUSCULAR | Status: DC | PRN

## 2021-02-16 MED ORDER — ONDANSETRON HCL 4 MG/2ML IJ SOLN
INTRAMUSCULAR | Status: DC | PRN
  Administered 2021-02-16: 4 mg via INTRAVENOUS

## 2021-02-16 MED ORDER — FENTANYL CITRATE (PF) 250 MCG/5ML IJ SOLN
INTRAMUSCULAR | Status: AC
  Filled 2021-02-16: qty 5

## 2021-02-16 MED ORDER — PROPOFOL 10 MG/ML IV BOLUS
INTRAVENOUS | Status: AC
  Filled 2021-02-16: qty 20

## 2021-02-16 MED ORDER — ROCURONIUM BROMIDE 10 MG/ML (PF) SYRINGE
PREFILLED_SYRINGE | INTRAVENOUS | Status: AC
  Filled 2021-02-16: qty 10

## 2021-02-16 MED ORDER — PROPOFOL 10 MG/ML IV BOLUS
INTRAVENOUS | Status: DC | PRN
  Administered 2021-02-16: 150 mg via INTRAVENOUS

## 2021-02-16 MED ORDER — SUGAMMADEX SODIUM 200 MG/2ML IV SOLN
INTRAVENOUS | Status: DC | PRN
  Administered 2021-02-16 (×2): 200 mg via INTRAVENOUS

## 2021-02-16 MED ORDER — VANCOMYCIN HCL IN DEXTROSE 1-5 GM/200ML-% IV SOLN
1000.0000 mg | INTRAVENOUS | Status: AC
  Administered 2021-02-16: 1000 mg via INTRAVENOUS
  Filled 2021-02-16: qty 200

## 2021-02-16 MED ORDER — ROCURONIUM BROMIDE 10 MG/ML (PF) SYRINGE
PREFILLED_SYRINGE | INTRAVENOUS | Status: DC | PRN
  Administered 2021-02-16: 60 mg via INTRAVENOUS
  Administered 2021-02-16: 40 mg via INTRAVENOUS

## 2021-02-16 MED ORDER — FENTANYL CITRATE (PF) 250 MCG/5ML IJ SOLN
INTRAMUSCULAR | Status: DC | PRN
  Administered 2021-02-16 (×2): 50 ug via INTRAVENOUS

## 2021-02-16 MED ORDER — KETOROLAC TROMETHAMINE 30 MG/ML IJ SOLN
INTRAMUSCULAR | Status: AC
  Filled 2021-02-16: qty 1

## 2021-02-16 MED ORDER — MIDAZOLAM HCL 2 MG/2ML IJ SOLN
INTRAMUSCULAR | Status: AC
  Filled 2021-02-16: qty 2

## 2021-02-16 MED ORDER — ONDANSETRON HCL 4 MG/2ML IJ SOLN
INTRAMUSCULAR | Status: AC
  Filled 2021-02-16: qty 2

## 2021-02-16 MED ORDER — PHENYLEPHRINE 40 MCG/ML (10ML) SYRINGE FOR IV PUSH (FOR BLOOD PRESSURE SUPPORT)
PREFILLED_SYRINGE | INTRAVENOUS | Status: DC | PRN
Start: 2021-02-16 — End: 2021-02-16
  Administered 2021-02-16: 80 ug via INTRAVENOUS
  Administered 2021-02-16: 120 ug via INTRAVENOUS
  Administered 2021-02-16: 80 ug via INTRAVENOUS
  Administered 2021-02-16: 120 ug via INTRAVENOUS

## 2021-02-16 MED ORDER — LACTATED RINGERS IV SOLN: INTRAVENOUS | Status: DC

## 2021-02-16 SURGICAL SUPPLY — 46 items
ADAPTER BRONCH F/PENTAX (ADAPTER) ×3 IMPLANT
ADAPTER VALVE BIOPSY EBUS (MISCELLANEOUS) IMPLANT
ADPR BSCP EDG PNTX (ADAPTER) ×1
ADPTR VALVE BIOPSY EBUS (MISCELLANEOUS)
BRUSH CYTOL CELLEBRITY 1.5X140 (MISCELLANEOUS) ×3 IMPLANT
BRUSH SUPERTRAX BIOPSY (INSTRUMENTS) IMPLANT
BRUSH SUPERTRAX NDL-TIP CYTO (INSTRUMENTS) ×3 IMPLANT
CANISTER SUCT 3000ML PPV (MISCELLANEOUS) ×3 IMPLANT
CHANNEL WORK EXTEND EDGE 180 (KITS) IMPLANT
CHANNEL WORK EXTEND EDGE 45 (KITS) IMPLANT
CHANNEL WORK EXTEND EDGE 90 (KITS) IMPLANT
CONT SPEC 4OZ CLIKSEAL STRL BL (MISCELLANEOUS) ×3 IMPLANT
COVER BACK TABLE 60X90IN (DRAPES) ×3 IMPLANT
FILTER STRAW FLUID ASPIR (MISCELLANEOUS) IMPLANT
FORCEPS BIOP SUPERTRX PREMAR (INSTRUMENTS) ×3 IMPLANT
GAUZE SPONGE 4X4 12PLY STRL (GAUZE/BANDAGES/DRESSINGS) ×3 IMPLANT
GLOVE SURG SS PI 7.5 STRL IVOR (GLOVE) ×6 IMPLANT
GOWN STRL REUS W/ TWL LRG LVL3 (GOWN DISPOSABLE) ×4 IMPLANT
GOWN STRL REUS W/TWL LRG LVL3 (GOWN DISPOSABLE) ×4
KIT CLEAN ENDO COMPLIANCE (KITS) ×3 IMPLANT
KIT LOCATABLE GUIDE (CANNULA) IMPLANT
KIT MARKER FIDUCIAL DELIVERY (KITS) IMPLANT
KIT PROCEDURE EDGE 180 (KITS) IMPLANT
KIT PROCEDURE EDGE 45 (KITS) IMPLANT
KIT PROCEDURE EDGE 90 (KITS) IMPLANT
KIT TURNOVER KIT B (KITS) ×3 IMPLANT
MARKER SKIN DUAL TIP RULER LAB (MISCELLANEOUS) ×3 IMPLANT
NDL SUPERTRX PREMARK BIOPSY (NEEDLE) ×2 IMPLANT
NEEDLE SUPERTRX PREMARK BIOPSY (NEEDLE) ×2 IMPLANT
NS IRRIG 1000ML POUR BTL (IV SOLUTION) ×3 IMPLANT
OIL SILICONE PENTAX (PARTS (SERVICE/REPAIRS)) ×3 IMPLANT
PAD ARMBOARD 7.5X6 YLW CONV (MISCELLANEOUS) ×6 IMPLANT
PATCHES PATIENT (LABEL) ×9 IMPLANT
SOL ANTI FOG 6CC (MISCELLANEOUS) ×2 IMPLANT
SOLUTION ANTI FOG 6CC (MISCELLANEOUS) ×1
SYR 20CC LL (SYRINGE) ×3 IMPLANT
SYR 20ML ECCENTRIC (SYRINGE) ×3 IMPLANT
SYR 50ML SLIP (SYRINGE) ×3 IMPLANT
TOWEL OR 17X24 6PK STRL BLUE (TOWEL DISPOSABLE) ×3 IMPLANT
TRAP SPECIMEN MUCOUS 40CC (MISCELLANEOUS) IMPLANT
TUBE CONNECTING 20X1/4 (TUBING) ×3 IMPLANT
UNDERPAD 30X30 (UNDERPADS AND DIAPERS) ×3 IMPLANT
VALVE BIOPSY  SINGLE USE (MISCELLANEOUS) ×2
VALVE BIOPSY SINGLE USE (MISCELLANEOUS) ×2 IMPLANT
VALVE SUCTION BRONCHIO DISP (MISCELLANEOUS) ×3 IMPLANT
WATER STERILE IRR 1000ML POUR (IV SOLUTION) ×3 IMPLANT

## 2021-02-16 NOTE — H&P (Signed)
Synopsis: Referred in Oct 2022 for lung nodule by [name], MD   Subjective:    PATIENT ID: Tristan Sims GENDER: male DOB: January 24, 1946, MRN: [id number]       Chief Complaint  Patient presents with   Consult      Pt. Wants to talk about lung nodule that has grown      This is a 76 year old gentleman, past medical history of diabetes, hypertension, hypercholesterolemia and tobacco use.  He quit smoking in 2019.  Had a 50+ pack year history of smoking.  He is originally from [place].  He is still working in [employer].  He had a lung cancer screening CT that showed a slowly enlarging right lower lobe pulmonary nodule.  This went from a subsolid state to a more solid state concerning for a primary bronchogenic carcinoma.  Patient has had a PET scan complete with no other distant metastasis.  As well as pulmonary function test which revealed normal spirometry, no significant obstruction Ratio of 75, FEV1 of 2.6 L, TLC 77%, DLCO 76% predicted.  Patient from a respiratory standpoint has no significant complaints and is able to complete all of his activities of daily living.  He does have a planned trip coming up at the beginning of December to visit family in [place] for approximately 1 month.  OV 02/16/2021: Here today for planned bronchoscopy with fiducial dye marking prior to surgical resection.          Past Medical History:  Diagnosis Date   Adenomatous polyps     Ascending aortic aneurysm     CAD (coronary artery disease)     DM (diabetes mellitus), type 2 (HCC)     HTN (hypertension)     Hypercholesteremia     Tobacco dependence             Family History  Problem Relation Age of Onset   Healthy Mother     Healthy Father     Diabetes Neg Hx        No past surgical history on file.   Social History         Socioeconomic History   Marital status: Married      Spouse name: Not on file   Number of children: Not on file   Years of education: Not on file    Highest education level: Not on file  Occupational History   Not on file  Tobacco Use   Smoking status: Former      Packs/day: 2.00      Years: 53.00      Pack years: 106.00      Types: Cigarettes   Smokeless tobacco: Never   Tobacco comments:      Counseled about need to quit smoking  Substance and Sexual Activity   Alcohol use: Not on file   Drug use: Not on file   Sexual activity: Not on file  Other Topics Concern   Not on file  Social History Narrative   Not on file    Social Determinants of Health    Financial Resource Strain: Not on file  Food Insecurity: Not on file  Transportation Needs: Not on file  Physical Activity: Not on file  Stress: Not on file  Social Connections: Not on file  Intimate Partner Violence: Not on file          Allergies  Allergen Reactions   Aspirin Hives   Penicillins Hives  Outpatient Medications Prior to Visit  Medication Sig Dispense Refill   atorvastatin (LIPITOR) 10 MG tablet Take 10 mg by mouth daily.       glucose blood (CONTOUR NEXT TEST) test strip 1 each by Other route daily. And lancets 1/day 100 each 12   JARDIANCE 25 MG TABS tablet Take 1 tablet (25 mg total) by mouth daily. 90 tablet 3   lisinopril (ZESTRIL) 40 MG tablet Take 40 mg by mouth daily.       metFORMIN (GLUCOPHAGE) 1000 MG tablet Take 1,000 mg by mouth 2 (two) times daily with a meal.       naproxen (NAPROSYN) 500 MG tablet Take 500 mg by mouth 2 (two) times daily with a meal.        No facility-administered medications prior to visit.      Review of Systems  Constitutional:  Negative for chills, fever, malaise/fatigue and weight loss.  HENT:  Negative for hearing loss, sore throat and tinnitus.   Eyes:  Negative for blurred vision and double vision.  Respiratory:  Negative for cough, hemoptysis, sputum production, shortness of breath, wheezing and stridor.   Cardiovascular:  Negative for chest pain, palpitations, orthopnea, leg swelling and  PND.  Gastrointestinal:  Negative for abdominal pain, constipation, diarrhea, heartburn, nausea and vomiting.  Genitourinary:  Negative for dysuria, hematuria and urgency.  Musculoskeletal:  Negative for joint pain and myalgias.  Skin:  Negative for itching and rash.  Neurological:  Negative for dizziness, tingling, weakness and headaches.  Endo/Heme/Allergies:  Negative for environmental allergies. Does not bruise/bleed easily.  Psychiatric/Behavioral:  Negative for depression. The patient is not nervous/anxious and does not have insomnia.   All other systems reviewed and are negative.      Objective:   General appearance: 76 y.o., male, NAD, conversant  Eyes: anicteric sclerae, moist conjunctivae; no lid-lag; PERRLA, tracking appropriately HENT: NCAT; oropharynx, MMM, no mucosal ulcerations; normal hard and soft palate Neck: Trachea midline; FROM, supple, lymphadenopathy, no JVD Lungs: CTAB, no crackles, no wheeze, with normal respiratory effort and no intercostal retractions CV: RRR, S1, S2, no MRGs  Abdomen: Soft, non-tender; non-distended, BS present  Extremities: No peripheral edema, radial and DP pulses present bilaterally  Skin: Normal temperature, turgor and texture; no rash Psych: Appropriate affect Neuro: Alert and oriented to person and place, no focal deficit     BP (!) 150/88    Pulse 67    Temp 98.4 F (36.9 C) (Oral)    Resp 18    Ht 5\' 9"  (1.753 m)    Wt 86.3 kg    SpO2 94%    BMI 28.10 kg/m     95% on RA    BMI Readings from Last 3 Encounters:  11/30/20 28.80 kg/m  09/27/20 27.55 kg/m  08/05/20 28.53 kg/m       Wt Readings from Last 3 Encounters:  11/30/20 195 lb (88.5 kg)  09/27/20 192 lb (87.1 kg)  08/05/20 193 lb 3.2 oz (87.6 kg)        CBC Labs (Brief)  No results found for: WBC, RBC, HGB, HCT, PLT, MCV, MCH, MCHC, RDW, LYMPHSABS, MONOABS, EOSABS, BASOSABS         Chest Imaging:   10/31/2020 LDCT: Longstanding history of smoking, more  solid enlarging right lower lobe pulmonary nodule concerning for malignancy. The patient's images have been independently reviewed by me.       Pulmonary Functions Testing Results: PFT Results Latest Ref Rng & Units 11/22/2020  FVC-Pre  L 3.23  FVC-Predicted Pre % 75  FVC-Post L 3.45  FVC-Predicted Post % 80  Pre FEV1/FVC % % 76  Post FEV1/FCV % % 75  FEV1-Pre L 2.44  FEV1-Predicted Pre % 78  FEV1-Post L 2.60  DLCO uncorrected ml/min/mmHg 19.39  DLCO UNC% % 76  DLCO corrected ml/min/mmHg 19.39  DLCO COR %Predicted % 76  DLVA Predicted % 92  TLC L 5.43  TLC % Predicted % 77  RV % Predicted % 85      FeNO:    Pathology:    Echocardiogram:    Heart Catheterization:     Assessment & Plan:        ICD-10-CM    1. Lung nodule  R91.1 Ambulatory referral to Cardiothoracic Surgery     2. Former smoker  Z87.891       3. Centrilobular emphysema ([place])  J43.2           Discussion:   This is a 76 year old gentleman, longstanding history of tobacco use, 100+ pack year history, smoked for 50 years.  Originally from [place].  Quit smoking in 2019.  Pulmonary function test completed today with no significant obstruction, spirometry is relatively normal.   Plan: Here today for RAB + RATS with me and Dr. Kipp Brood. Discussed all risks benefits and alternatives Patient is agreeable to proceed.   [name], DO [facility] 02/16/2021 7:31 AM

## 2021-02-16 NOTE — Transfer of Care (Signed)
Immediate Anesthesia Transfer of Care Note  Patient: Tristan Sims  Procedure(s) Performed: XI ROBOTIC ASSISTED THORASCOPY-RIGHT LOWER LOBE WEDGE RESECTION, possible lobectomy (canceled)  Patient Location: PACU  Anesthesia Type:General  Level of Consciousness: awake, alert  and oriented  Airway & Oxygen Therapy: Patient Spontanous Breathing and Patient connected to nasal cannula oxygen  Post-op Assessment: Report given to RN, Post -op Vital signs reviewed and stable and Patient moving all extremities X 4  Post vital signs: Reviewed and stable  Last Vitals:  Vitals Value Taken Time  BP 119/65 02/16/21 0830  Temp 36.7 C 02/16/21 0830  Pulse 68 02/16/21 0843  Resp 14 02/16/21 0843  SpO2 93 % 02/16/21 0843  Vitals shown include unvalidated device data.  Last Pain:  Vitals:   02/16/21 0830  TempSrc:   PainSc: 0-No pain         Complications: No notable events documented.

## 2021-02-16 NOTE — Anesthesia Procedure Notes (Signed)
Procedure Name: Intubation Date/Time: 02/16/2021 7:44 AM Performed by: Mariea Clonts, CRNA Pre-anesthesia Checklist: Patient identified, Emergency Drugs available, Suction available and Patient being monitored Patient Re-evaluated:Patient Re-evaluated prior to induction Oxygen Delivery Method: Circle System Utilized Preoxygenation: Pre-oxygenation with 100% oxygen Induction Type: IV induction Ventilation: Mask ventilation without difficulty Laryngoscope Size: Miller and 3 Grade View: Grade I Tube type: Oral Tube size: 8.5 mm Number of attempts: 1 Airway Equipment and Method: Stylet and Oral airway Placement Confirmation: ETT inserted through vocal cords under direct vision, positive ETCO2 and breath sounds checked- equal and bilateral Tube secured with: Tape Dental Injury: Teeth and Oropharynx as per pre-operative assessment

## 2021-02-16 NOTE — H&P (Signed)
[place] Record #[id number] Date of Birth: 12-Mar-1945   Referring: [name], DO Primary Care: [name], MD Primary Cardiologist: None     No events since his last clinic appointment  Vitals:   02/16/21 0548  BP: (!) 150/88  Pulse: 67  Resp: 18  Temp: 98.4 F (36.9 C)  SpO2: 94%   Alert NAD Sinus EWOB  OR today for Nav Bronch, followed by R RATS, RLL wedge, possible lobectomy  Per my last clinic note   Chief Complaint:        Chief Complaint  Patient presents with   Lung Lesion      Surgical consult, Chest CT 10/31/20, PET Scan 11/21/20, PFT's 11/22/20      History of Present Illness:    Tristan Sims 76 y.o. male presents for surgical evaluation of a 9 mm that was found on screening CT.  The patient does have a significant smoking history but quit in 2019.  He denies any neurologic symptoms or weight loss.  He has been having some exertional shortness of breath for the last year.  PET/CT was performed which showed mild uptake, but no distant metastatic disease.             Zubrod Score: At the time of surgery this patients most appropriate activity status/level should be described as: [x]     0    Normal activity, no symptoms []     1    Restricted in physical strenuous activity but ambulatory, able to do out light work []     2    Ambulatory and capable of self care, unable to do work activities, up and about               >50 % of waking hours                              []     3    Only limited self care, in bed greater than 50% of waking hours []     4    Completely disabled, no self care, confined to bed or chair []     5    Moribund         Past Medical History:  Diagnosis Date   Adenomatous polyps     Ascending aortic aneurysm     CAD (coronary artery disease)     DM (diabetes mellitus), type 2 (HCC)     HTN (hypertension)     Hypercholesteremia     Tobacco dependence        No past surgical history on file.         Family History  Problem Relation Age of Onset   Healthy Mother     Healthy Father     Diabetes Neg Hx          Social History        Tobacco Use  Smoking Status Former   Packs/day: 2.00   Years: 53.00   Pack years: 106.00   Types: Cigarettes  Smokeless Tobacco Never  Tobacco Comments    Counseled about need to quit smoking    Social History       Substance and Sexual Activity  Alcohol Use None            Allergies  Allergen Reactions   Aspirin Hives   Lisinopril Other (See Comments)   Penicillins Hives   Prednisone Other (See Comments)  Current Outpatient Medications  Medication Sig Dispense Refill   atorvastatin (LIPITOR) 10 MG tablet Take 10 mg by mouth daily.       glucose blood (CONTOUR NEXT TEST) test strip 1 each by Other route daily. And lancets 1/day 100 each 12   JARDIANCE 25 MG TABS tablet Take 1 tablet (25 mg total) by mouth daily. 90 tablet 3   lisinopril (ZESTRIL) 40 MG tablet Take 40 mg by mouth daily.       metFORMIN (GLUCOPHAGE) 1000 MG tablet Take 1,000 mg by mouth 2 (two) times daily with a meal.       naproxen (NAPROSYN) 500 MG tablet Take 500 mg by mouth 2 (two) times daily with a meal.        No current facility-administered medications for this visit.      Review of Systems  Constitutional: Negative.   Respiratory:  Positive for shortness of breath. Negative for cough.   Cardiovascular: Negative.   Neurological: Negative.       PHYSICAL EXAMINATION: BP 122/82    Pulse 80    Resp 20    Ht 5\' 9"  (1.753 m)    Wt 194 lb (88 kg)    SpO2 95% Comment: RA   BMI 28.65 kg/m  Physical Exam Constitutional:      General: He is not in acute distress.    Appearance: Normal appearance. He is normal weight. He is not ill-appearing.  HENT:     Head: Normocephalic and atraumatic.  Eyes:     Extraocular Movements: Extraocular movements intact.  Cardiovascular:     Rate and Rhythm: Normal rate.  Pulmonary:     Effort: Pulmonary  effort is normal. No respiratory distress.  Abdominal:     General: There is no distension.  Musculoskeletal:        General: Normal range of motion.     Cervical back: Normal range of motion.  Skin:    General: Skin is warm and dry.  Neurological:     General: No focal deficit present.     Mental Status: He is alert and oriented to person, place, and time.      Diagnostic Studies & Laboratory data:     Recent Radiology Findings:    Imaging Results  NM PET Image Initial (PI) Skull Base To Thigh   Result Date: 11/22/2020 CLINICAL DATA:  Initial treatment strategy for enlarging part solid right lower lobe pulmonary nodule. EXAM: NUCLEAR MEDICINE PET SKULL BASE TO THIGH TECHNIQUE: 9.56 mCi F-18 FDG was injected intravenously. Full-ring PET imaging was performed from the skull base to thigh after the radiotracer. CT data was obtained and used for attenuation correction and anatomic localization. Fasting blood glucose: 139 mg/dl COMPARISON:  CT chest 10/31/2020, 09/14/2019 and 03/16/2019. FINDINGS: Mediastinal blood pool activity: SUV max 1.9 NECK: No hypermetabolic cervical lymph nodes are identified.Symmetric activity within the lymphoid tissue of Waldeyer's ring is within physiologic limits. No other lesions of the pharyngeal mucosal space identified. Incidental CT findings: Bilateral carotid atherosclerosis. CHEST: There are no hypermetabolic mediastinal, hilar or axillary lymph nodes. There is no hypermetabolic activity within the lesion of concern in the right lower lobe which measures approximately 6 mm on image 47/8. No other suspicious pulmonary nodules or hypermetabolic pulmonary activity. Incidental CT findings: Atherosclerosis of the aorta, great vessels and coronary arteries. Mild to moderate centrilobular and paraseptal emphysema. ABDOMEN/PELVIS: There is no hypermetabolic activity within the liver, adrenal glands, spleen or pancreas. There is no hypermetabolic nodal activity. There  is  focal hypermetabolic activity at the left prostate apex (SUV max 6.4). Incidental CT findings: Diffuse hepatic steatosis. There is 1.6 cm low-density lesion centrally in the right hepatic lobe (image 108/4), likely a cyst. Aortic and branch vessel atherosclerosis with mild dilatation of the infrarenal aorta to 2.9 cm AP. This is not more than 50% more dilated than the proximal aorta which measures 2.3 cm. No follow-up recommended. This recommendation follows ACR consensus guidelines: White Paper of the ACR Incidental Findings Committee II on Vascular Findings. J Am Coll Radiol 2013; 10:789-794. There is scattered colonic diverticulosis with nonspecific wall thickening in the rectosigmoid colon. 1.1 cm exophytic lesion from the lower pole of the left kidney (image 137/4) demonstrates thin peripheral calcification and no metabolic activity, likely a mildly complex cyst. The prostate gland is mildly enlarged. SKELETON: There is no hypermetabolic activity to suggest osseous metastatic disease. Incidental CT findings: Mild thoracolumbar spondylosis. IMPRESSION: 1. The slow growing right lower lobe pulmonary nodule demonstrates no hypermetabolic activity. While reassuring, this does not exclude low-grade malignancy such as adenocarcinoma. 2. Focal hypermetabolic activity at the left prostatic apex. Recommend correlation with serum PSA levels and consideration of urology consultation. 3. Incidental findings as above, including hepatic steatosis, probable hepatic and left renal cysts, coronary andAortic Atherosclerosis (ICD10-I70.0) and Emphysema (ICD10-J43.9). Electronically Signed   By: Richardean Sale M.D.   On: 11/22/2020 15:27           I have independently reviewed the above radiology studies  and reviewed the findings with the patient.    Recent Lab Findings: Recent Labs       Lab Results  Component Value Date    GLUCOSE 129 (H) 08/09/2020    CHOL 158 08/05/2020    TRIG 348 (H) 08/05/2020    HDL 29 (L)  08/05/2020    LDLCALC 73 08/05/2020    ALT 70 (H) 06/16/2019    AST 48 (H) 06/16/2019    NA 141 08/09/2020    K 4.9 08/09/2020    CL 106 08/09/2020    CREATININE 1.23 08/09/2020    BUN 34 (H) 08/09/2020    CO2 21 08/09/2020    HGBA1C 6.7 (A) 09/27/2020          PFTs: - FVC: 75% - FEV1: 78% -DLCO: 76%   Problem List: 9.2 mm right lower lobe pulmonary nodule.  Minimal uptake on PET/CT.     Assessment / Plan:   76 year old male with 9.2 mm right lower lobe pulmonary nodule.  Patient does have a significant history of smoking but quit in 2019.  Patient would be a candidate for combination procedure.  He is scheduled to go to [place] after Thanksgiving and will not be back until after Christmas.  I will see him back in January prior to his surgical date.      I  spent 40 minutes with  the patient face to face in counseling and coordination of care.

## 2021-02-16 NOTE — Progress Notes (Signed)
Patient in phase II waiting for Dr. Kipp Brood to come and talk to him. Vital signs stable. No further monitoring necessary.   Gabriel Rainwater, RN

## 2021-02-16 NOTE — Progress Notes (Signed)
°   °  [address]       [place] [address]             [phone]       +Covid infection on Jan1st.  He has been asymptomatic Desatting on vent for endoscopy Unable to differentiate nodule for infiltrative process Will cancel case and reschedule.

## 2021-02-16 NOTE — Op Note (Signed)
Video Bronchoscopy with Robotic Assisted Bronchoscopic Navigation   Date of Operation: 02/16/2021   Pre-op Diagnosis: Right lower lobe pulmonary nodule  Post-op Diagnosis: Right lower lobe pulmonary nodule  Surgeon: [name], DO  Assistants: none   Anesthesia: General endotracheal anesthesia  Operation: Flexible video fiberoptic bronchoscopy with robotic assistance and biopsies.  Estimated Blood Loss: Minimal  Complications: None  Indications and History: Tristan Sims is a 76 y.o. male with history of RLL nodule. The risks, benefits, complications, treatment options and expected outcomes were discussed with the patient.  The possibilities of pneumothorax, pneumonia, reaction to medication, pulmonary aspiration, perforation of a viscus, bleeding, failure to diagnose a condition and creating a complication requiring transfusion or operation were discussed with the patient who freely signed the consent.    Description of Procedure: The patient was seen in the Preoperative Area, was examined and was deemed appropriate to proceed.  The patient was taken to [facility] endoscopy room 3, identified as [place] and the procedure verified as Flexible Video Fiberoptic Bronchoscopy.  A Time Out was held and the above information confirmed.   Prior to the date of the procedure a high-resolution CT scan of the chest was performed. Utilizing ION software program a virtual tracheobronchial tree was generated to allow the creation of distinct navigation pathways to the patient's parenchymal abnormalities. After being taken to the operating room general anesthesia was initiated and the patient  was orally intubated. The video fiberoptic bronchoscope was introduced via the endotracheal tube and a general inspection was performed which showed normal right and left lung anatomy, aspiration of the bilateral mainstems was completed to remove any remaining secretions. Robotic catheter inserted into patient's  endotracheal tube.   Target #1 RLL: The distinct navigation pathways prepared prior to this procedure were then utilized to navigate to patient's lesion identified on CT scan. The robotic catheter was secured into place and the vision probe was withdrawn.  Lesion location was approximated using fluoroscopy, three-dimensional cone beam CT imaging, radial endobronchial ultrasound for peripheral targeting.  After three-dimensional CT imaging there was a significant amount of lower lobe atelectasis and the initial target was not visualized.  Patient had increasing O2 requirement needs during the case and after discussion with thoracic surgery the decision was made to abort plans for operative intervention.  Also not being able to identify the target and three-dimensional CT imaging felt it unnecessary to proceed with tissue sampling.  At the end of the procedure a general airway inspection was performed and there was no evidence of active bleeding. The bronchoscope was removed.  The patient tolerated the procedure well. There was no significant blood loss and there were no obvious complications. A post-procedural chest x-ray is pending.  Samples Target #1: 1. None   Plans:  The patient will be discharged from the PACU to home when recovered from anesthesia and after chest x-ray is reviewed. Outpatient followup will be with [name], DO.  [name], DO [facility] 02/16/2021 8:28 AM

## 2021-02-16 NOTE — Interval H&P Note (Signed)
History and Physical Interval Note:  02/16/2021 7:33 AM  Tristan Sims  has presented today for surgery, with the diagnosis of lung nodule.  The various methods of treatment have been discussed with the patient and family. After consideration of risks, benefits and other options for treatment, the patient has consented to  Procedure(s) with comments: [place] (N/A) - ION w/ CIOS as a surgical intervention.  The patient's history has been reviewed, patient examined, no change in status, stable for surgery.  I have reviewed the patient's chart and labs.  Questions were answered to the patient's satisfaction.     [place]

## 2021-02-16 NOTE — Discharge Instructions (Signed)
Flexible Bronchoscopy, Care After This sheet gives you information about how to care for yourself after your test. Your doctor may also give you more specific instructions. If you have problems or questions, contact your doctor. Follow these instructions at home: Eating and drinking Do not eat or drink anything (not even water) for 2 hours after your test, or until your numbing medicine (local anesthetic) wears off. When your numbness is gone and your cough and gag reflexes have come back, you may: Eat only soft foods. Slowly drink liquids. The day after the test, go back to your normal diet. Driving Do not drive for 24 hours if you were given a medicine to help you relax (sedative). Do not drive or use heavy machinery while taking prescription pain medicine. General instructions  Take over-the-counter and prescription medicines only as told by your doctor. Return to your normal activities as told. Ask what activities are safe for you. Do not use any products that have nicotine or tobacco in them. This includes cigarettes and e-cigarettes. If you need help quitting, ask your doctor. Keep all follow-up visits as told by your doctor. This is important. It is very important if you had a tissue sample (biopsy) taken. Get help right away if: You have shortness of breath that gets worse. You get light-headed. You feel like you are going to pass out (faint). You have chest pain. You cough up: More than a little blood. More blood than before. Summary Do not eat or drink anything (not even water) for 2 hours after your test, or until your numbing medicine wears off. Do not use cigarettes. Do not use e-cigarettes. Get help right away if you have chest pain.  This information is not intended to replace advice given to you by your health care provider. Make sure you discuss any questions you have with your health care provider. Document Released: 11/19/2008 Document Revised: 01/04/2017 Document  Reviewed: 02/10/2016 Elsevier Patient Education  2020 [employer].

## 2021-02-17 ENCOUNTER — Ambulatory Visit: Payer: Medicare Other | Admitting: Pulmonary Disease

## 2021-02-17 ENCOUNTER — Encounter (HOSPITAL_COMMUNITY): Payer: Self-pay | Admitting: Pulmonary Disease

## 2021-02-21 ENCOUNTER — Other Ambulatory Visit (HOSPITAL_COMMUNITY): Payer: Medicare Other

## 2021-02-21 ENCOUNTER — Other Ambulatory Visit: Payer: Medicare Other

## 2021-02-23 DIAGNOSIS — R911 Solitary pulmonary nodule: Secondary | ICD-10-CM

## 2021-03-20 ENCOUNTER — Encounter: Payer: Self-pay | Admitting: Pulmonary Disease

## 2021-03-20 ENCOUNTER — Other Ambulatory Visit: Payer: Self-pay

## 2021-03-20 ENCOUNTER — Ambulatory Visit: Payer: Medicare Other | Admitting: Pulmonary Disease

## 2021-03-20 VITALS — BP 102/40 | HR 67 | Temp 97.7°F | Ht 69.0 in | Wt 193.4 lb

## 2021-03-20 DIAGNOSIS — J432 Centrilobular emphysema: Secondary | ICD-10-CM

## 2021-03-20 DIAGNOSIS — R911 Solitary pulmonary nodule: Secondary | ICD-10-CM

## 2021-03-20 DIAGNOSIS — Z87891 Personal history of nicotine dependence: Secondary | ICD-10-CM

## 2021-03-20 DIAGNOSIS — Z8616 Personal history of COVID-19: Secondary | ICD-10-CM

## 2021-03-20 MED ORDER — PREDNISONE 10 MG PO TABS: ORAL_TABLET | ORAL | 0 refills | Status: DC

## 2021-03-20 NOTE — Progress Notes (Signed)
Synopsis: Referred in Oct 2022 for lung nodule by [name], MD  Subjective:   PATIENT ID: Tristan Sims GENDER: male DOB: May 23, 1945, MRN: [id number]  Chief Complaint  Patient presents with   Follow-up    This is a 76 year old gentleman, past medical history of diabetes, hypertension, hypercholesterolemia and tobacco use.  He quit smoking in 2019.  Had a 50+ pack year history of smoking.  He is originally from [place].  He is still working in [employer].  He had a lung cancer screening CT that showed a slowly enlarging right lower lobe pulmonary nodule.  This went from a subsolid state to a more solid state concerning for a primary bronchogenic carcinoma.  Patient has had a PET scan complete with no other distant metastasis.  As well as pulmonary function test which revealed normal spirometry, no significant obstruction Ratio of 75, FEV1 of 2.6 L, TLC 77%, DLCO 76% predicted.  Patient from a respiratory standpoint has no significant complaints and is able to complete all of his activities of daily living.  He does have a planned trip coming up at the beginning of December to visit family in [place] for approximately 1 month.  OV 03/20/2021: Here today for follow-up postoperative from robotic assisted navigational bronchoscopy.  Patient was initially planned as a single anesthetic event, combination case with myself and Dr. Kipp Brood.  Unfortunately right before this the patient had a new diagnosis of COVID-19.  He is super D CT imaging also showed peripheral infiltrates.  At the time of induction and the start of the bronchoscopic portion of the procedure the patient had significant oxygenation issues and hypoxemia.  The procedure was also limited on three-dimensional CT imaging as to significant infiltrates present and areas of atelectasis unable to complete adequate tissue sampling and biopsy.  The procedure then was therefore aborted and no plans for lobectomy.  Here today to  discuss next steps on consideration for rescheduling.  Unfortunately still has some ongoing cough and shortness of breath.  He does feel better.  His wife is also slowly recovering.   Past Medical History:  Diagnosis Date   Adenomatous polyps    Ascending aortic aneurysm    CAD (coronary artery disease)    DM (diabetes mellitus), type 2 (HCC)    HTN (hypertension)    Hypercholesteremia    Tobacco dependence      Family History  Problem Relation Age of Onset   Healthy Mother    Healthy Father    Diabetes Neg Hx      Past Surgical History:  Procedure Laterality Date   EYE SURGERY Bilateral    cataract removal   LACERATION REPAIR Left 2002   hand   VIDEO BRONCHOSCOPY WITH ENDOBRONCHIAL NAVIGATION N/A 02/16/2021   Procedure: VIDEO BRONCHOSCOPY WITH ENDOBRONCHIAL NAVIGATION;  Surgeon: [name], DO;  Location: [facility];  Service: Pulmonary;  Laterality: N/A;  ION w/ CIOS    Social History   Socioeconomic History   Marital status: Married    Spouse name: [name]   Number of children: 4   Years of education: Not on file   Highest education level: Not on file  Occupational History   Not on file  Tobacco Use   Smoking status: Former    Packs/day: 2.00    Years: 53.00    Pack years: 106.00    Types: Cigarettes    Quit date: 2019    Years since quitting: 4.1   Smokeless tobacco: Never  Tobacco comments:    Counseled about need to quit smoking  Vaping Use   Vaping Use: Never used  Substance and Sexual Activity   Alcohol use: Not Currently   Drug use: Never   Sexual activity: Not Currently  Other Topics Concern   Not on file  Social History Narrative   Not on file   Social Determinants of Health   Financial Resource Strain: Not on file  Food Insecurity: Not on file  Transportation Needs: Not on file  Physical Activity: Not on file  Stress: Not on file  Social Connections: Not on file  Intimate Partner Violence: Not on file     Allergies   Allergen Reactions   Aspirin Hives   Lisinopril Other (See Comments)    Unsure of reaction    Penicillins Hives   Prednisone Other (See Comments)    Unsure of reaction      Outpatient Medications Prior to Visit  Medication Sig Dispense Refill   atorvastatin (LIPITOR) 10 MG tablet Take 10 mg by mouth daily.     glucose blood (CONTOUR NEXT TEST) test strip 1 each by Other route daily. And lancets 1/day 100 each 12   irbesartan (AVAPRO) 75 MG tablet Take 75 mg by mouth daily.     JARDIANCE 25 MG TABS tablet Take 1 tablet (25 mg total) by mouth daily. 90 tablet 3   metFORMIN (GLUCOPHAGE) 1000 MG tablet Take 1,000 mg by mouth daily.     No facility-administered medications prior to visit.    Review of Systems  Constitutional:  Negative for chills, fever, malaise/fatigue and weight loss.  HENT:  Negative for hearing loss, sore throat and tinnitus.   Eyes:  Negative for blurred vision and double vision.  Respiratory:  Positive for cough and shortness of breath. Negative for hemoptysis, sputum production, wheezing and stridor.   Cardiovascular:  Negative for chest pain, palpitations, orthopnea, leg swelling and PND.  Gastrointestinal:  Negative for abdominal pain, constipation, diarrhea, heartburn, nausea and vomiting.  Genitourinary:  Negative for dysuria, hematuria and urgency.  Musculoskeletal:  Negative for joint pain and myalgias.  Skin:  Negative for itching and rash.  Neurological:  Negative for dizziness, tingling, weakness and headaches.  Endo/Heme/Allergies:  Negative for environmental allergies. Does not bruise/bleed easily.  Psychiatric/Behavioral:  Negative for depression. The patient is not nervous/anxious and does not have insomnia.   All other systems reviewed and are negative.   Objective:  Physical Exam Vitals reviewed.  Constitutional:      General: He is not in acute distress.    Appearance: He is well-developed.  HENT:     Head: Normocephalic and atraumatic.   Eyes:     General: No scleral icterus.    Conjunctiva/sclera: Conjunctivae normal.     Pupils: Pupils are equal, round, and reactive to light.  Neck:     Vascular: No JVD.     Trachea: No tracheal deviation.  Cardiovascular:     Rate and Rhythm: Normal rate and regular rhythm.     Heart sounds: Normal heart sounds. No murmur heard. Pulmonary:     Effort: Pulmonary effort is normal. No tachypnea, accessory muscle usage or respiratory distress.     Breath sounds: No stridor. No wheezing, rhonchi or rales.  Abdominal:     General: There is no distension.     Palpations: Abdomen is soft.     Tenderness: There is no abdominal tenderness.  Musculoskeletal:        General: No tenderness.  Cervical back: Neck supple.  Lymphadenopathy:     Cervical: No cervical adenopathy.  Skin:    General: Skin is warm and dry.     Capillary Refill: Capillary refill takes less than 2 seconds.     Findings: No rash.  Neurological:     Mental Status: He is alert and oriented to person, place, and time.  Psychiatric:        Behavior: Behavior normal.     Vitals:   03/20/21 1345  BP: (!) 102/40  Pulse: 67  Temp: 97.7 F (36.5 C)  TempSrc: Oral  SpO2: 97%  Weight: 193 lb 6.4 oz (87.7 kg)  Height: 5\' 9"  (1.753 m)    97% on RA BMI Readings from Last 3 Encounters:  03/20/21 28.56 kg/m  02/16/21 28.10 kg/m  02/15/21 28.04 kg/m   Wt Readings from Last 3 Encounters:  03/20/21 193 lb 6.4 oz (87.7 kg)  02/16/21 190 lb 4.1 oz (86.3 kg)  02/15/21 189 lb 14.4 oz (86.1 kg)     CBC    Component Value Date/Time   WBC 6.2 02/15/2021 1000   RBC 4.68 02/15/2021 1000   HGB 14.6 02/15/2021 1000   HCT 44.1 02/15/2021 1000   PLT 245 02/15/2021 1000   MCV 94.2 02/15/2021 1000   MCH 31.2 02/15/2021 1000   MCHC 33.1 02/15/2021 1000   RDW 12.6 02/15/2021 1000      Chest Imaging:  10/31/2020 LDCT: Longstanding history of smoking, more solid enlarging right lower lobe pulmonary nodule  concerning for malignancy. The patient's images have been independently reviewed by me.    January 2023 CT chest: 9 mm pulmonary nodule right lower lobe stable in comparison to previous imaging. Peripheral groundglass opacities consistent with recent COVID-19 diagnosis. The patient's images have been independently reviewed by me.     Pulmonary Functions Testing Results: PFT Results Latest Ref Rng & Units 11/22/2020  FVC-Pre L 3.23  FVC-Predicted Pre % 75  FVC-Post L 3.45  FVC-Predicted Post % 80  Pre FEV1/FVC % % 76  Post FEV1/FCV % % 75  FEV1-Pre L 2.44  FEV1-Predicted Pre % 78  FEV1-Post L 2.60  DLCO uncorrected ml/min/mmHg 19.39  DLCO UNC% % 76  DLCO corrected ml/min/mmHg 19.39  DLCO COR %Predicted % 76  DLVA Predicted % 92  TLC L 5.43  TLC % Predicted % 77  RV % Predicted % 85    FeNO:   Pathology:   Echocardiogram:   Heart Catheterization:     Assessment & Plan:     ICD-10-CM   1. Lung nodule  R91.1 CT Super D Chest Wo Contrast    2. Former smoker  Z87.891     3. Centrilobular emphysema ([place])  J43.2     4. History of COVID-19  Z86.16        Discussion:  This is a 76 year old gentleman longstanding history of tobacco use, 100+ year pack year history, smoked for 15 years.  Originally from [place] quit smoking in 2019.  Pulmonary function test completed with no significant obstruction and relatively normal spirometry.  Plan: We will have a repeat CT scan of the chest to see if his infiltrates look any better from recent COVID-19. This will also serve as a follow-up for his lung nodule. If this nodule is still present and he has recovered fully from [place] then we can consider rescheduling his procedure with Dr. Kipp Brood.  Once I get this back I will contact their office and determine what we should  do next. Patient is agreeable to this plan.     Current Outpatient Medications:    atorvastatin (LIPITOR) 10 MG tablet, Take 10 mg by mouth daily., Disp:  , Rfl:    glucose blood (CONTOUR NEXT TEST) test strip, 1 each by Other route daily. And lancets 1/day, Disp: 100 each, Rfl: 12   irbesartan (AVAPRO) 75 MG tablet, Take 75 mg by mouth daily., Disp: , Rfl:    JARDIANCE 25 MG TABS tablet, Take 1 tablet (25 mg total) by mouth daily., Disp: 90 tablet, Rfl: 3   metFORMIN (GLUCOPHAGE) 1000 MG tablet, Take 1,000 mg by mouth daily., Disp: , Rfl:    [name], DO [facility] 03/20/2021 1:56 PM

## 2021-03-20 NOTE — Patient Instructions (Addendum)
Thank you for visiting Dr. Valeta Harms at [facility]. Today we recommend the following:  Orders Placed This Encounter  Procedures   CT Super D Chest Wo Contrast   Meds ordered this encounter  Medications   predniSONE (DELTASONE) 10 MG tablet    Sig: Take 4 tabs by mouth once daily x4 days, then 3 tabs x4 days, 2 tabs x4 days, 1 tab x4 days and stop.    Dispense:  40 tablet    Refill:  0   Return in about 5 weeks (around 04/24/2021) for with Eric Form, NP, or Dr. Valeta Harms.    Please do your part to reduce the spread of COVID-19.

## 2021-03-29 ENCOUNTER — Ambulatory Visit (INDEPENDENT_AMBULATORY_CARE_PROVIDER_SITE_OTHER)
Admission: RE | Admit: 2021-03-29 | Discharge: 2021-03-29 | Disposition: A | Discharge status: Home / Self Care | Payer: Medicare Other | Source: Ambulatory Visit | Attending: Pulmonary Disease | Admitting: Pulmonary Disease

## 2021-03-29 ENCOUNTER — Other Ambulatory Visit: Payer: Self-pay

## 2021-03-29 DIAGNOSIS — I700 Atherosclerosis of aorta: Secondary | ICD-10-CM | POA: Diagnosis not present

## 2021-03-29 DIAGNOSIS — R911 Solitary pulmonary nodule: Secondary | ICD-10-CM

## 2021-03-29 DIAGNOSIS — I7121 Aneurysm of the ascending aorta, without rupture: Secondary | ICD-10-CM | POA: Diagnosis not present

## 2021-03-29 DIAGNOSIS — J439 Emphysema, unspecified: Secondary | ICD-10-CM | POA: Diagnosis not present

## 2021-03-31 DIAGNOSIS — E1169 Type 2 diabetes mellitus with other specified complication: Secondary | ICD-10-CM | POA: Diagnosis not present

## 2021-03-31 DIAGNOSIS — Z0001 Encounter for general adult medical examination with abnormal findings: Secondary | ICD-10-CM | POA: Diagnosis not present

## 2021-03-31 DIAGNOSIS — I77810 Thoracic aortic ectasia: Secondary | ICD-10-CM | POA: Diagnosis not present

## 2021-03-31 DIAGNOSIS — K760 Fatty (change of) liver, not elsewhere classified: Secondary | ICD-10-CM | POA: Diagnosis not present

## 2021-03-31 DIAGNOSIS — Z79899 Other long term (current) drug therapy: Secondary | ICD-10-CM | POA: Diagnosis not present

## 2021-03-31 DIAGNOSIS — E7800 Pure hypercholesterolemia, unspecified: Secondary | ICD-10-CM | POA: Diagnosis not present

## 2021-04-11 ENCOUNTER — Other Ambulatory Visit: Payer: Self-pay

## 2021-04-11 ENCOUNTER — Ambulatory Visit (INDEPENDENT_AMBULATORY_CARE_PROVIDER_SITE_OTHER): Payer: Medicare Other | Admitting: Endocrinology

## 2021-04-11 VITALS — BP 130/84 | HR 83 | Ht 69.0 in | Wt 190.2 lb

## 2021-04-11 DIAGNOSIS — E119 Type 2 diabetes mellitus without complications: Secondary | ICD-10-CM

## 2021-04-11 LAB — POCT GLYCOSYLATED HEMOGLOBIN (HGB A1C): Hemoglobin A1C: 8.7 % — AB (ref 4.0–5.6)

## 2021-04-11 MED ORDER — CONTOUR NEXT ONE DEVI: 1.0000 | Freq: Every day | 0 refills | Status: AC

## 2021-04-11 MED ORDER — METFORMIN HCL ER 500 MG PO TB24: 1000.0000 mg | ORAL_TABLET | Freq: Every day | ORAL | 3 refills | Status: AC

## 2021-04-11 MED ORDER — CONTOUR NEXT TEST VI STRP: 1.0000 | ORAL_STRIP | Freq: Every day | 12 refills | Status: DC

## 2021-04-11 NOTE — Patient Instructions (Addendum)
check your blood sugar once a day.  vary the time of day when you check, between before the 3 meals, and at bedtime.  also check if you have symptoms of your blood sugar being too high or too low.  please keep a record of the readings and bring it to your next appointment here (or you can bring the meter itself).  You can write it on any piece of paper.  please call us sooner if your blood sugar goes below 70, or if most of your readings are over 200. ?I have sent a prescription to your pharmacy, for a blood sugar meter and strips.  ?Please continue the same 2 diabetes medications.  ?Please come back for a follow-up appointment in 2 months.   ? ? ?ki?m tra l??ng ???ng trong m?u c?a b?n m?i ng?y m?t l?n. thay ??i th?i gian trong ng?y khi b?n ki?m tra, gi?a tr??c 3 b?a ?n v? tr??c khi ?i ng?. ??ng th?i ki?m tra xem b?n c? c?c tri?u ch?ng c?a l??ng ???ng trong m?u qu? cao ho?c qu? th?p hay kh?ng. vui l?ng ghi l?i c?c l?n ??c v? mang ??n cu?c h?n ti?p theo c?a b?n t?i ??y (ho?c b?n c? th? t? mang theo m?y ?o). B?n c? th? vi?t n? tr?n b?t k? m?nh gi?y n?o. vui l?ng g?i cho ch?ng t?i s?m h?n n?u l??ng ???ng trong m?u c?a b?n xu?ng d??i 70 ho?c n?u h?u h?t c?c ch? s? c?a b?n ??u tr?n 200. ?T?i ?? g?i ??n thu?c ??n hi?u thu?c c?a b?n, ?? mua m?y ?o ???ng huy?t v? que th?. ?H?y ti?p t?c 2 lo?i thu?c ti?u ???ng nh? c?. ?Xin h?n t?i kh?m sau 2 th?ng.   ? ? ?

## 2021-04-11 NOTE — Progress Notes (Signed)
? ?Subjective:  ? ? Patient ID: Tristan Sims, male    DOB: March 12, 1945, 76 y.o.   MRN: [id number] ? ?HPI ?Pt returns for f/u of diabetes mellitus: ?DM type: 2 ?Dx'ed: 2013 ?Complications: CAD and CRI ?Therapy: 2 oral meds ?DKA: never ?Severe hypoglycemia: never ?Pancreatitis: never ?Pancreatic imaging: none known ?SDOH: none ?Other: he has never been on insulin ?Interval history: Pt says he does not check cbg.  He recently finished course of prednisone.   ?Past Medical History:  ?Diagnosis Date  ? Adenomatous polyps   ? Ascending aortic aneurysm   ? CAD (coronary artery disease)   ? DM (diabetes mellitus), type 2 ([place])   ? HTN (hypertension)   ? Hypercholesteremia   ? Tobacco dependence   ? ? ?Past Surgical History:  ?Procedure Laterality Date  ? EYE SURGERY Bilateral   ? cataract removal  ? LACERATION REPAIR Left 2002  ? hand  ? VIDEO BRONCHOSCOPY WITH ENDOBRONCHIAL NAVIGATION N/A 02/16/2021  ? Procedure: VIDEO BRONCHOSCOPY WITH ENDOBRONCHIAL NAVIGATION;  Surgeon: [name], DO;  Location: [facility];  Service: Pulmonary;  Laterality: N/A;  ION w/ CIOS  ? ? ?Social History  ? ?Socioeconomic History  ? Marital status: Married  ?  Spouse name: [name]  ? Number of children: 4  ? Years of education: Not on file  ? Highest education level: Not on file  ?Occupational History  ? Not on file  ?Tobacco Use  ? Smoking status: Former  ?  Packs/day: 2.00  ?  Years: 53.00  ?  Pack years: 106.00  ?  Types: Cigarettes  ?  Quit date: 2019  ?  Years since quitting: 4.1  ? Smokeless tobacco: Never  ? Tobacco comments:  ?  Counseled about need to quit smoking  ?Vaping Use  ? Vaping Use: Never used  ?Substance and Sexual Activity  ? Alcohol use: Not Currently  ? Drug use: Never  ? Sexual activity: Not Currently  ?Other Topics Concern  ? Not on file  ?Social History Narrative  ? Not on file  ? ?Social Determinants of Health  ? ?Financial Resource Strain: Not on file  ?Food Insecurity: Not on file  ?Transportation Needs: Not on  file  ?Physical Activity: Not on file  ?Stress: Not on file  ?Social Connections: Not on file  ?Intimate Partner Violence: Not on file  ? ? ?Current Outpatient Medications on File Prior to Visit  ?Medication Sig Dispense Refill  ? atorvastatin (LIPITOR) 10 MG tablet Take 10 mg by mouth daily.    ? irbesartan (AVAPRO) 75 MG tablet Take 75 mg by mouth daily.    ? JARDIANCE 25 MG TABS tablet Take 1 tablet (25 mg total) by mouth daily. 90 tablet 3  ? ?No current facility-administered medications on file prior to visit.  ? ? ?Allergies  ?Allergen Reactions  ? Aspirin Hives  ? Lisinopril Other (See Comments)  ?  Unsure of reaction   ? Penicillins Hives  ? Prednisone Other (See Comments)  ?  Unsure of reaction   ? ? ?Family History  ?Problem Relation Age of Onset  ? Healthy Mother   ? Healthy Father   ? Diabetes Neg Hx   ? ? ?BP 130/84   Pulse 83   Ht '5\' 9"'$  (1.753 m)   Wt 190 lb 3.2 oz (86.3 kg)   SpO2 94%   BMI 28.09 kg/m?  ? ?Review of Systems ? ?   ?Objective:  ? Physical Exam ? ? ? ?A1c=8.7% ?   ?  Assessment & Plan:  ?Type 2 DM: uncontrolled, due to prednisone.  We'll recheck when he is off this.   ? ?Patient Instructions  ?check your blood sugar once a day.  vary the time of day when you check, between before the 3 meals, and at bedtime.  also check if you have symptoms of your blood sugar being too high or too low.  please keep a record of the readings and bring it to your next appointment here (or you can bring the meter itself).  You can write it on any piece of paper.  please call us sooner if your blood sugar goes below 70, or if most of your readings are over 200. ?I have sent a prescription to your pharmacy, for a blood sugar meter and strips.  ?Please continue the same 2 diabetes medications.  ?Please come back for a follow-up appointment in 2 months.   ? ? ?ki?m tra l??ng ???ng trong m?u c?a b?n m?i ng?y m?t l?n. thay ??i th?i gian trong ng?y khi b?n ki?m tra, gi?a tr??c 3 b?a ?n v? tr??c khi ?i ng?. ??ng  th?i ki?m tra xem b?n c? c?c tri?u ch?ng c?a l??ng ???ng trong m?u qu? cao ho?c qu? th?p hay kh?ng. vui l?ng ghi l?i c?c l?n ??c v? mang ??n cu?c h?n ti?p theo c?a b?n t?i ??y (ho?c b?n c? th? t? mang theo m?y ?o). B?n c? th? vi?t n? tr?n b?t k? m?nh gi?y n?o. vui l?ng g?i cho ch?ng t?i s?m h?n n?u l??ng ???ng trong m?u c?a b?n xu?ng d??i 70 ho?c n?u h?u h?t c?c ch? s? c?a b?n ??u tr?n 200. ?T?i ?? g?i ??n thu?c ??n hi?u thu?c c?a b?n, ?? mua m?y ?o ???ng huy?t v? que th?. ?H?y ti?p t?c 2 lo?i thu?c ti?u ???ng nh? c?. ?Xin h?n t?i kh?m sau 2 th?ng.   ? ? ? ?

## 2021-04-19 ENCOUNTER — Other Ambulatory Visit: Payer: Self-pay

## 2021-04-19 ENCOUNTER — Ambulatory Visit: Payer: Medicare Other | Admitting: Pulmonary Disease

## 2021-04-19 ENCOUNTER — Encounter: Payer: Self-pay | Admitting: Pulmonary Disease

## 2021-04-19 VITALS — BP 100/80 | HR 72 | Temp 97.7°F | Ht 69.0 in | Wt 195.0 lb

## 2021-04-19 DIAGNOSIS — R948 Abnormal results of function studies of other organs and systems: Secondary | ICD-10-CM | POA: Diagnosis not present

## 2021-04-19 DIAGNOSIS — Z87891 Personal history of nicotine dependence: Secondary | ICD-10-CM | POA: Diagnosis not present

## 2021-04-19 DIAGNOSIS — R911 Solitary pulmonary nodule: Secondary | ICD-10-CM | POA: Diagnosis not present

## 2021-04-19 LAB — PSA: PSA: 1.2 ng/mL (ref 0.10–4.00)

## 2021-04-19 NOTE — Progress Notes (Signed)
? ?Synopsis: Referred in Oct 2022 for lung nodule by [name], MD ? ?Subjective:  ? ?PATIENT ID: Tristan Sims GENDER: male DOB: 17-Jan-1946, MRN: [id number] ? ?Chief Complaint  ?Patient presents with  ? Follow-up  ?  Follow up.   ? ? ?This is a 76 year old gentleman, past medical history of diabetes, hypertension, hypercholesterolemia and tobacco use.  He quit smoking in 2019.  Had a 50+ pack year history of smoking.  He is originally from [place].  He is still working in [employer].  He had a lung cancer screening CT that showed a slowly enlarging right lower lobe pulmonary nodule.  This went from a subsolid state to a more solid state concerning for a primary bronchogenic carcinoma.  Patient has had a PET scan complete with no other distant metastasis.  As well as pulmonary function test which revealed normal spirometry, no significant obstruction Ratio of 75, FEV1 of 2.6 L, TLC 77%, DLCO 76% predicted.  Patient from a respiratory standpoint has no significant complaints and is able to complete all of his activities of daily living.  He does have a planned trip coming up at the beginning of December to visit family in [place] for approximately 1 month. ? ?OV 03/20/2021: Here today for follow-up postoperative from robotic assisted navigational bronchoscopy.  Patient was initially planned as a single anesthetic event, combination case with myself and Dr. Kipp Brood.  Unfortunately right before this the patient had a new diagnosis of COVID-19.  He is super D CT imaging also showed peripheral infiltrates.  At the time of induction and the start of the bronchoscopic portion of the procedure the patient had significant oxygenation issues and hypoxemia.  The procedure was also limited on three-dimensional CT imaging as to significant infiltrates present and areas of atelectasis unable to complete adequate tissue sampling and biopsy.  The procedure then was therefore aborted and no plans for lobectomy.   Here today to discuss next steps on consideration for rescheduling.  Unfortunately still has some ongoing cough and shortness of breath.  He does feel better.  His wife is also slowly recovering. ? ?OV 04/19/2021: Patient here today for follow-up.  Patient had recentCT scan of the chest was completed on 03/29/2021 which was completed for evaluation of a right lower lobe follow-up.  Right lower lobe pulmonary nodule that was being followed has now decreased in size measuring approximately 8 mm.  His infiltrates that were located in the periphery related to his COVID-19 infection have also resolved.  Overall the patient is doing much better from a respiratory standpoint.  Overall has no complaints.  Off prednisone.  We did review his nuclear medicine PET scan which showed a hypermetabolic lesion in the prostate gland.  We talked about the significance of this today in the office as well ? ? ?Past Medical History:  ?Diagnosis Date  ? Adenomatous polyps   ? Ascending aortic aneurysm   ? CAD (coronary artery disease)   ? DM (diabetes mellitus), type 2 ([place])   ? HTN (hypertension)   ? Hypercholesteremia   ? Tobacco dependence   ?  ? ?Family History  ?Problem Relation Age of Onset  ? Healthy Mother   ? Healthy Father   ? Diabetes Neg Hx   ?  ? ?Past Surgical History:  ?Procedure Laterality Date  ? EYE SURGERY Bilateral   ? cataract removal  ? LACERATION REPAIR Left 2002  ? hand  ? VIDEO BRONCHOSCOPY WITH ENDOBRONCHIAL NAVIGATION N/A 02/16/2021  ?  Procedure: VIDEO BRONCHOSCOPY WITH ENDOBRONCHIAL NAVIGATION;  Surgeon: [name], DO;  Location: [facility];  Service: Pulmonary;  Laterality: N/A;  ION w/ CIOS  ? ? ?Social History  ? ?Socioeconomic History  ? Marital status: Married  ?  Spouse name: [name]  ? Number of children: 4  ? Years of education: Not on file  ? Highest education level: Not on file  ?Occupational History  ? Not on file  ?Tobacco Use  ? Smoking status: Former  ?  Packs/day: 2.00  ?  Years: 53.00  ?   Pack years: 106.00  ?  Types: Cigarettes  ?  Quit date: 2019  ?  Years since quitting: 4.2  ? Smokeless tobacco: Never  ? Tobacco comments:  ?  Counseled about need to quit smoking  ?Vaping Use  ? Vaping Use: Never used  ?Substance and Sexual Activity  ? Alcohol use: Not Currently  ? Drug use: Never  ? Sexual activity: Not Currently  ?Other Topics Concern  ? Not on file  ?Social History Narrative  ? Not on file  ? ?Social Determinants of Health  ? ?Financial Resource Strain: Not on file  ?Food Insecurity: Not on file  ?Transportation Needs: Not on file  ?Physical Activity: Not on file  ?Stress: Not on file  ?Social Connections: Not on file  ?Intimate Partner Violence: Not on file  ?  ? ?Allergies  ?Allergen Reactions  ? Aspirin Hives  ? Lisinopril Other (See Comments)  ?  Unsure of reaction   ? Penicillins Hives  ? Prednisone Other (See Comments)  ?  Unsure of reaction   ?  ? ?Outpatient Medications Prior to Visit  ?Medication Sig Dispense Refill  ? atorvastatin (LIPITOR) 10 MG tablet Take 10 mg by mouth daily.    ? Blood Glucose Monitoring Suppl (CONTOUR NEXT ONE) DEVI 1 Device by Does not apply route daily. 1 each 0  ? glucose blood (CONTOUR NEXT TEST) test strip 1 each by Other route daily. And lancets 1/day 100 each 12  ? irbesartan (AVAPRO) 75 MG tablet Take 75 mg by mouth daily.    ? JARDIANCE 25 MG TABS tablet Take 1 tablet (25 mg total) by mouth daily. 90 tablet 3  ? metFORMIN (GLUCOPHAGE-XR) 500 MG 24 hr tablet Take 2 tablets (1,000 mg total) by mouth daily. 180 tablet 3  ? ?No facility-administered medications prior to visit.  ? ? ?Review of Systems  ?Constitutional:  Negative for chills, fever, malaise/fatigue and weight loss.  ?HENT:  Negative for hearing loss, sore throat and tinnitus.   ?Eyes:  Negative for blurred vision and double vision.  ?Respiratory:  Negative for cough, hemoptysis, sputum production, shortness of breath, wheezing and stridor.   ?Cardiovascular:  Negative for chest pain,  palpitations, orthopnea, leg swelling and PND.  ?Gastrointestinal:  Negative for abdominal pain, constipation, diarrhea, heartburn, nausea and vomiting.  ?Genitourinary:  Negative for dysuria, hematuria and urgency.  ?Musculoskeletal:  Negative for joint pain and myalgias.  ?Skin:  Negative for itching and rash.  ?Neurological:  Negative for dizziness, tingling, weakness and headaches.  ?Endo/Heme/Allergies:  Negative for environmental allergies. Does not bruise/bleed easily.  ?Psychiatric/Behavioral:  Negative for depression. The patient is not nervous/anxious and does not have insomnia.   ?All other systems reviewed and are negative. ? ? ?Objective:  ?Physical Exam ?Vitals reviewed.  ?Constitutional:   ?   General: He is not in acute distress. ?   Appearance: He is well-developed. He is obese.  ?HENT:  ?  Head: Normocephalic and atraumatic.  ?Eyes:  ?   General: No scleral icterus. ?   Conjunctiva/sclera: Conjunctivae normal.  ?   Pupils: Pupils are equal, round, and reactive to light.  ?Neck:  ?   Vascular: No JVD.  ?   Trachea: No tracheal deviation.  ?Cardiovascular:  ?   Rate and Rhythm: Normal rate and regular rhythm.  ?   Heart sounds: Normal heart sounds. No murmur heard. ?Pulmonary:  ?   Effort: Pulmonary effort is normal. No tachypnea, accessory muscle usage or respiratory distress.  ?   Breath sounds: No stridor. No wheezing, rhonchi or rales.  ?Abdominal:  ?   General: There is no distension.  ?   Palpations: Abdomen is soft.  ?   Tenderness: There is no abdominal tenderness.  ?Musculoskeletal:     ?   General: No tenderness.  ?   Cervical back: Neck supple.  ?Lymphadenopathy:  ?   Cervical: No cervical adenopathy.  ?Skin: ?   General: Skin is warm and dry.  ?   Capillary Refill: Capillary refill takes less than 2 seconds.  ?   Findings: No rash.  ?Neurological:  ?   Mental Status: He is alert and oriented to person, place, and time.  ?Psychiatric:     ?   Behavior: Behavior normal.  ? ? ? ?Vitals:  ?  04/19/21 0850  ?BP: 100/80  ?Pulse: 72  ?Temp: 97.7 ?F (36.5 ?C)  ?TempSrc: Oral  ?SpO2: 96%  ?Weight: 195 lb (88.5 kg)  ?Height: '5\' 9"'$  (1.753 m)  ? ? ?96% on RA ?BMI Readings from Last 3 Encounters:  ?04/19/21

## 2021-04-19 NOTE — Patient Instructions (Signed)
Thank you for visiting Dr. Valeta Harms at [facility]. ?Today we recommend the following: ? ?Orders Placed This Encounter  ?Procedures  ? CT Super D Chest W Wo Contrast  ? PSA  ? Ambulatory referral to Urology  ? ?Follow up with [place] after CT chest complete in 6 months  ? ?Return in about 6 months (around 10/20/2021) for with Eric Form, NP, or Dr. Valeta Harms. ? ? ? ?Please do your part to reduce the spread of COVID-19.  ? ?

## 2021-04-21 NOTE — Progress Notes (Signed)
Please let patient know that PSA is low.  Encourage patient to follow-up with urology. ? ?Thanks, ? ?BLI ? ?[name], DO ?[facility] ?04/21/2021 4:16 PM  ?

## 2021-06-12 ENCOUNTER — Ambulatory Visit: Payer: Medicare Other | Admitting: Endocrinology

## 2021-06-12 VITALS — BP 126/80 | HR 73 | Ht 69.0 in | Wt 194.2 lb

## 2021-06-12 DIAGNOSIS — E1165 Type 2 diabetes mellitus with hyperglycemia: Secondary | ICD-10-CM | POA: Diagnosis not present

## 2021-06-12 DIAGNOSIS — E119 Type 2 diabetes mellitus without complications: Secondary | ICD-10-CM

## 2021-06-12 LAB — POCT GLYCOSYLATED HEMOGLOBIN (HGB A1C): Hemoglobin A1C: 8.8 % — AB (ref 4.0–5.6)

## 2021-06-12 MED ORDER — ONETOUCH VERIO VI STRP: 1.0000 | ORAL_STRIP | Freq: Every day | 3 refills | Status: AC

## 2021-06-12 MED ORDER — ONETOUCH VERIO W/DEVICE KIT: 1.0000 | PACK | Freq: Once | 0 refills | Status: AC

## 2021-06-12 MED ORDER — PIOGLITAZONE HCL 45 MG PO TABS: 45.0000 mg | ORAL_TABLET | Freq: Every day | ORAL | 1 refills | Status: DC

## 2021-06-12 NOTE — Patient Instructions (Addendum)
check your blood sugar once a day.  vary the time of day when you check, between before the 3 meals, and at bedtime.  also check if you have symptoms of your blood sugar being too high or too low.  please keep a record of the readings and bring it to your next appointment here (or you can bring the meter itself).  You can write it on any piece of paper.  please call us sooner if your blood sugar goes below 70, or if most of your readings are over 200.   ?I have sent a prescription to your pharmacy, to add pioglitazone.   ?Please continue the same other 2 diabetes medications.  ?I have also sent a prescription to your pharmacy, for a new meter and strips ?Please come back for a follow-up appointment in 2 months.   ? ? ?ki?m tra l??ng ???ng trong m?u c?a b?n m?i ng?y m?t l?n. thay ??i th?i gian trong ng?y khi b?n ki?m tra, gi?a tr??c 3 b?a ?n v? tr??c khi ?i ng?. ??ng th?i ki?m tra xem b?n c? c?c tri?u ch?ng c?a l??ng ???ng trong m?u qu? cao ho?c qu? th?p hay kh?ng. vui l?ng ghi l?i c?c l?n ??c v? mang ??n cu?c h?n ti?p theo c?a b?n t?i ??y (ho?c b?n c? th? t? mang theo m?y ?o). B?n c? th? vi?t n? tr?n b?t k? m?nh gi?y n?o. vui l?ng g?i cho ch?ng t?i s?m h?n n?u l??ng ???ng trong m?u c?a b?n xu?ng d??i 70 ho?c n?u h?u h?t c?c ch? s? c?a b?n ??u tr?n 200. ?T?i ?? g?i ??n thu?c ??n hi?u thu?c c?a b?n, ?? th?m pioglitazone. ?H?y ti?p t?c c?ng 2 lo?i thu?c ti?u ???ng kh?c. ?T?i c?ng ?? g?i ??n thu?c ??n hi?u thu?c c?a b?n, cho m?t m?y ?o m?i v? c?c d?i ?Xin h?n t?i kh?m sau 2 th?ng.  ?

## 2021-06-12 NOTE — Progress Notes (Signed)
? ?Subjective:  ? ? Patient ID: Tristan Sims, male    DOB: 1945-10-23, 76 y.o.   MRN: [id number] ? ?HPI ?Pt declines interpreter ?Pt returns for f/u of diabetes mellitus:  ?DM type: 2 ?Dx'ed: 2013 ?Complications: CAD and CRI ?Therapy: 2 oral meds ?DKA: never ?Severe hypoglycemia: never ?Pancreatitis: never ?Pancreatic imaging: none known ?SDOH: he does not check cbg.   ?Other: he has never been on insulin ?Interval history: pt states he feels well in general.  No recent steroids.  Pt says meds are too expensive.  Pt says meter did not work.  ?Past Medical History:  ?Diagnosis Date  ? Adenomatous polyps   ? Ascending aortic aneurysm   ? CAD (coronary artery disease)   ? DM (diabetes mellitus), type 2 ([place])   ? HTN (hypertension)   ? Hypercholesteremia   ? Tobacco dependence   ? ? ?Past Surgical History:  ?Procedure Laterality Date  ? EYE SURGERY Bilateral   ? cataract removal  ? LACERATION REPAIR Left 2002  ? hand  ? VIDEO BRONCHOSCOPY WITH ENDOBRONCHIAL NAVIGATION N/A 02/16/2021  ? Procedure: VIDEO BRONCHOSCOPY WITH ENDOBRONCHIAL NAVIGATION;  Surgeon: [name], DO;  Location: [facility];  Service: Pulmonary;  Laterality: N/A;  ION w/ CIOS  ? ? ?Social History  ? ?Socioeconomic History  ? Marital status: Married  ?  Spouse name: [name]  ? Number of children: 4  ? Years of education: Not on file  ? Highest education level: Not on file  ?Occupational History  ? Not on file  ?Tobacco Use  ? Smoking status: Former  ?  Packs/day: 2.00  ?  Years: 53.00  ?  Pack years: 106.00  ?  Types: Cigarettes  ?  Quit date: 2019  ?  Years since quitting: 4.3  ? Smokeless tobacco: Never  ? Tobacco comments:  ?  Counseled about need to quit smoking  ?Vaping Use  ? Vaping Use: Never used  ?Substance and Sexual Activity  ? Alcohol use: Not Currently  ? Drug use: Never  ? Sexual activity: Not Currently  ?Other Topics Concern  ? Not on file  ?Social History Narrative  ? Not on file  ? ?Social Determinants of Health  ? ?Financial  Resource Strain: Not on file  ?Food Insecurity: Not on file  ?Transportation Needs: Not on file  ?Physical Activity: Not on file  ?Stress: Not on file  ?Social Connections: Not on file  ?Intimate Partner Violence: Not on file  ? ? ?Current Outpatient Medications on File Prior to Visit  ?Medication Sig Dispense Refill  ? atorvastatin (LIPITOR) 10 MG tablet Take 10 mg by mouth daily.    ? Blood Glucose Monitoring Suppl (CONTOUR NEXT ONE) DEVI 1 Device by Does not apply route daily. 1 each 0  ? irbesartan (AVAPRO) 75 MG tablet Take 75 mg by mouth daily.    ? JARDIANCE 25 MG TABS tablet Take 1 tablet (25 mg total) by mouth daily. 90 tablet 3  ? metFORMIN (GLUCOPHAGE-XR) 500 MG 24 hr tablet Take 2 tablets (1,000 mg total) by mouth daily. 180 tablet 3  ? ?No current facility-administered medications on file prior to visit.  ? ? ?Allergies  ?Allergen Reactions  ? Aspirin Hives  ? Lisinopril Other (See Comments)  ?  Unsure of reaction   ? Penicillins Hives  ? Prednisone Other (See Comments)  ?  Unsure of reaction   ? ? ?Family History  ?Problem Relation Age of Onset  ? Healthy Mother   ?  Healthy Father   ? Diabetes Neg Hx   ? ? ?BP 126/80 (BP Location: Left Arm, Patient Position: Sitting, Cuff Size: Normal)   Pulse 73   Ht '5\' 9"'$  (1.753 m)   Wt 194 lb 3.2 oz (88.1 kg)   SpO2 97%   BMI 28.68 kg/m?  ? ? ?Review of Systems ? ?   ?Objective:  ? Physical Exam ?VITAL SIGNS:  See vs page ?GENERAL: no distress ?EXT: no leg edema.  ? ?Lab Results  ?Component Value Date  ? CREATININE 0.94 02/15/2021  ? BUN 15 02/15/2021  ? NA 138 02/15/2021  ? K 3.9 02/15/2021  ? CL 108 02/15/2021  ? CO2 20 (L) 02/15/2021  ? ? ? ?Lab Results  ?Component Value Date  ? HGBA1C 8.8 (A) 06/12/2021  ? ?   ?Assessment & Plan:  ?Type 2 DM: uncontrolled.  ? ?Patient Instructions  ?check your blood sugar once a day.  vary the time of day when you check, between before the 3 meals, and at bedtime.  also check if you have symptoms of your blood sugar being  too high or too low.  please keep a record of the readings and bring it to your next appointment here (or you can bring the meter itself).  You can write it on any piece of paper.  please call us sooner if your blood sugar goes below 70, or if most of your readings are over 200.   ?I have sent a prescription to your pharmacy, to add pioglitazone.   ?Please continue the same other 2 diabetes medications.  ?I have also sent a prescription to your pharmacy, for a new meter and strips ?Please come back for a follow-up appointment in 2 months.   ? ? ?ki?m tra l??ng ???ng trong m?u c?a b?n m?i ng?y m?t l?n. thay ??i th?i gian trong ng?y khi b?n ki?m tra, gi?a tr??c 3 b?a ?n v? tr??c khi ?i ng?. ??ng th?i ki?m tra xem b?n c? c?c tri?u ch?ng c?a l??ng ???ng trong m?u qu? cao ho?c qu? th?p hay kh?ng. vui l?ng ghi l?i c?c l?n ??c v? mang ??n cu?c h?n ti?p theo c?a b?n t?i ??y (ho?c b?n c? th? t? mang theo m?y ?o). B?n c? th? vi?t n? tr?n b?t k? m?nh gi?y n?o. vui l?ng g?i cho ch?ng t?i s?m h?n n?u l??ng ???ng trong m?u c?a b?n xu?ng d??i 70 ho?c n?u h?u h?t c?c ch? s? c?a b?n ??u tr?n 200. ?T?i ?? g?i ??n thu?c ??n hi?u thu?c c?a b?n, ?? th?m pioglitazone. ?H?y ti?p t?c c?ng 2 lo?i thu?c ti?u ???ng kh?c. ?T?i c?ng ?? g?i ??n thu?c ??n hi?u thu?c c?a b?n, cho m?t m?y ?o m?i v? c?c d?i ?Xin h?n t?i kh?m sau 2 th?ng.  ? ? ?

## 2021-08-10 ENCOUNTER — Encounter: Payer: Self-pay | Admitting: Cardiovascular Disease

## 2021-08-10 NOTE — Progress Notes (Signed)
Cardiology Office Note:    Date:  08/11/2021   ID:  Tristan Sims, DOB August 01, 1945, MRN [id number]  PCP:  [name], MD  Cardiologist:  None  Electrophysiologist:  None   Referring MD: [name], MD   Chief Complaint  Patient presents with   coronary artery calcifications     Previous notes:    Originally from [place] to [place] in 1975.  Tristan Sims is a 76 y.o. male with a hx of hypertension, diabetes mellitus and hyperlipidemia.  He also has a history of an ascending aortic aneurysm.  He had a CT scan recently that revealed some coronary artery calcifications.  We are asked to see him today for further evaluation of his coronary artery calcifications.  Recent CT scan reveals an ascending aorta measuring 4.0 cm.     Labs from his primary medical doctor reveals a total cholesterol of 110.  The triglyceride level was 218.  His HDL is 29.  LDL is 37.  Is very active.  Is a Museum/gallery curator.   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  No CP or tightness   Former smoker ,  Quit 2 years ago .     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  Has some pain / numbness in his left shoulder  Not a chest pain ,  just a MSK pain .  Check lipids today   August 11, 2021 Tristan Sims is seen for follow up of his coronary artery calcifications, DM, HTN, HLD  Originally from [place] to [place] in 1975.    Past Medical History:  Diagnosis Date   Adenomatous polyps    Ascending aortic aneurysm (HCC)    CAD (coronary artery disease)    DM (diabetes mellitus), type 2 (HCC)    HTN (hypertension)    Hypercholesteremia    Tobacco dependence     Past Surgical History:  Procedure Laterality Date   EYE SURGERY Bilateral    cataract removal   LACERATION REPAIR Left 2002   hand   VIDEO BRONCHOSCOPY WITH ENDOBRONCHIAL NAVIGATION N/A  02/16/2021   Procedure: VIDEO BRONCHOSCOPY WITH ENDOBRONCHIAL NAVIGATION;  Surgeon: [name], DO;  Location: [facility];  Service: Pulmonary;  Laterality: N/A;  ION w/ CIOS    Current Medications: Current Meds  Medication Sig   atorvastatin (LIPITOR) 10 MG tablet Take 10 mg by mouth daily.   Blood Glucose Monitoring Suppl (CONTOUR NEXT ONE) DEVI 1 Device by Does not apply route daily.   glucose blood (ONETOUCH VERIO) test strip 1 each by Other route daily. And lancets 1/day   irbesartan (AVAPRO) 75 MG tablet Take 75 mg by mouth daily.   JARDIANCE 25 MG TABS tablet Take 1 tablet (25 mg total) by mouth daily.   metFORMIN (GLUCOPHAGE-XR) 500 MG 24 hr tablet Take 2 tablets (1,000 mg total) by mouth daily.   pioglitazone (ACTOS) 45 MG tablet Take 1 tablet (45 mg total) by mouth daily.     Allergies:   Aspirin, Lisinopril, Penicillins, and Prednisone   Social History   Socioeconomic History   Marital status: Married    Spouse name: Tristan Sims   Number of children: 4   Years of education: Not on file   Highest education level: Not on file  Occupational History   Not on  file  Tobacco Use   Smoking status: Former    Packs/day: 2.00    Years: 53.00    Total pack years: 106.00    Types: Cigarettes    Quit date: 2019    Years since quitting: 4.5   Smokeless tobacco: Never   Tobacco comments:    Counseled about need to quit smoking  Vaping Use   Vaping Use: Never used  Substance and Sexual Activity   Alcohol use: Not Currently   Drug use: Never   Sexual activity: Not Currently  Other Topics Concern   Not on file  Social History Narrative   Not on file   Social Determinants of Health   Financial Resource Strain: Not on file  Food Insecurity: Not on file  Transportation Needs: Not on file  Physical Activity: Not on file  Stress: Not on file  Social Connections: Not on file     Family History: The patient's family history includes Healthy in his father and mother.  There is no history of Diabetes.  Mother and father lived in [place].   Never went to the doctor   ROS:   Please see the history of present illness.     All other systems reviewed and are negative.  EKGs/Labs/Other Studies Reviewed:    The following studies were reviewed today:    Recent Labs: 02/15/2021: ALT 41; BUN 15; Creatinine, Ser 0.94; Hemoglobin 14.6; Platelets 245; Potassium 3.9; Sodium 138  Recent Lipid Panel    Component Value Date/Time   CHOL 158 08/05/2020 0823   TRIG 348 (H) 08/05/2020 0823   HDL 29 (L) 08/05/2020 0823   CHOLHDL 5.4 (H) 08/05/2020 0823   LDLCALC 73 08/05/2020 0823    Physical Exam:     Physical Exam: Blood pressure 100/78, pulse 80, height '5\' 9"'$  (1.753 m), weight 195 lb 12.8 oz (88.8 kg), SpO2 93 %.  GEN:  Well nourished, well developed in no acute distress HEENT: Normal NECK: No JVD; No carotid bruits LYMPHATICS: No lymphadenopathy CARDIAC: RRR , no murmurs, rubs, gallops RESPIRATORY:  Clear to auscultation without rales, wheezing or rhonchi  ABDOMEN: Soft, non-tender, non-distended MUSCULOSKELETAL:  No edema; No deformity  SKIN: Warm and dry NEUROLOGIC:  Alert and oriented x 3   EKG:       ASSESSMENT:    1. Coronary artery calcification   2. Primary hypertension   3. Type 2 diabetes mellitus without complication, unspecified whether long term insulin use ([place])   4. Mixed hyperlipidemia      PLAN:      Coronary artery calcifications: Not having any episodes of angina.  Continue current medications.  .  2.  Hyperlipidemia:     Cholesterol levels look good.  His triglyceride level is elevated.  He eats [employer] or B[employer] almost every day for lunch.  I have asked him to work on cutting back on his bread and his saturated fat content.     Medication Adjustments/Labs and Tests Ordered: Current medicines are reviewed at length with the patient today.  Concerns regarding medicines are outlined above.  No orders of the  defined types were placed in this encounter.    No orders of the defined types were placed in this encounter.    Patient Instructions  Medication Instructions:  Your physician recommends that you continue on your current medications as directed. Please refer to the Current Medication list given to you today.  *If you need a refill on your cardiac medications before your  next appointment, please call your pharmacy*   Lab Work: NONE If you have labs (blood work) drawn today and your tests are completely normal, you will receive your results only by: [facility] (if you have MyChart) OR A paper copy in the mail If you have any lab test that is abnormal or we need to change your treatment, we will call you to review the results.   Testing/Procedures: NONE   Follow-Up: At [facility], you and your health needs are our priority.  As part of our continuing mission to provide you with exceptional heart care, we have created designated Provider Care Teams.  These Care Teams include your primary Cardiologist (physician) and Advanced Practice Providers (APPs -  Physician Assistants and Nurse Practitioners) who all work together to provide you with the care you need, when you need it.  We recommend signing up for the patient portal called "MyChart".  Sign up information is provided on this After Visit Summary.  MyChart is used to connect with patients for Virtual Visits (Telemedicine).  Patients are able to view lab/test results, encounter notes, upcoming appointments, etc.  Non-urgent messages can be sent to your provider as well.   To learn more about what you can do with MyChart, go to [email or web address].    Your next appointment:   1 year(s)  The format for your next appointment:   In Person  Provider:   Ronn Melena, or Gabrial Poppell {  Important Information About Sugar         Signed, Mertie Moores, MD  08/11/2021 5:11 PM    [place]

## 2021-08-11 ENCOUNTER — Ambulatory Visit: Payer: Medicare Other | Admitting: Cardiovascular Disease

## 2021-08-11 ENCOUNTER — Encounter: Payer: Self-pay | Admitting: Cardiovascular Disease

## 2021-08-11 VITALS — BP 100/78 | HR 80 | Ht 69.0 in | Wt 195.8 lb

## 2021-08-11 DIAGNOSIS — I2510 Atherosclerotic heart disease of native coronary artery without angina pectoris: Secondary | ICD-10-CM

## 2021-08-11 DIAGNOSIS — I10 Essential (primary) hypertension: Secondary | ICD-10-CM | POA: Diagnosis not present

## 2021-08-11 DIAGNOSIS — I2584 Coronary atherosclerosis due to calcified coronary lesion: Secondary | ICD-10-CM

## 2021-08-11 DIAGNOSIS — E782 Mixed hyperlipidemia: Secondary | ICD-10-CM | POA: Diagnosis not present

## 2021-08-11 DIAGNOSIS — E119 Type 2 diabetes mellitus without complications: Secondary | ICD-10-CM | POA: Diagnosis not present

## 2021-08-11 NOTE — Patient Instructions (Addendum)
Medication Instructions:  Your physician recommends that you continue on your current medications as directed. Please refer to the Current Medication list given to you today.  *If you need a refill on your cardiac medications before your next appointment, please call your pharmacy*   Lab Work: NONE If you have labs (blood work) drawn today and your tests are completely normal, you will receive your results only by: [place] (if you have MyChart) OR A paper copy in the mail If you have any lab test that is abnormal or we need to change your treatment, we will call you to review the results.   Testing/Procedures: NONE   Follow-Up: At [facility], you and your health needs are our priority.  As part of our continuing mission to provide you with exceptional heart care, we have created designated Provider Care Teams.  These Care Teams include your primary Cardiologist (physician) and Advanced Practice Providers (APPs -  Physician Assistants and Nurse Practitioners) who all work together to provide you with the care you need, when you need it.  We recommend signing up for the patient portal called "MyChart".  Sign up information is provided on this After Visit Summary.  MyChart is used to connect with patients for Virtual Visits (Telemedicine).  Patients are able to view lab/test results, encounter notes, upcoming appointments, etc.  Non-urgent messages can be sent to your provider as well.   To learn more about what you can do with MyChart, go to [email or web address].    Your next appointment:   1 year(s)  The format for your next appointment:   In Person  Provider:   Ronn Melena, or [place] {  Important Information About [place]

## 2021-09-20 NOTE — Telephone Encounter (Signed)
Closing encounter

## 2021-09-28 DIAGNOSIS — I77810 Thoracic aortic ectasia: Secondary | ICD-10-CM | POA: Diagnosis not present

## 2021-09-28 DIAGNOSIS — Z125 Encounter for screening for malignant neoplasm of prostate: Secondary | ICD-10-CM | POA: Diagnosis not present

## 2021-09-28 DIAGNOSIS — E119 Type 2 diabetes mellitus without complications: Secondary | ICD-10-CM | POA: Diagnosis not present

## 2021-09-28 DIAGNOSIS — Z79899 Other long term (current) drug therapy: Secondary | ICD-10-CM | POA: Diagnosis not present

## 2021-09-28 DIAGNOSIS — E7800 Pure hypercholesterolemia, unspecified: Secondary | ICD-10-CM | POA: Diagnosis not present

## 2021-09-28 DIAGNOSIS — E1169 Type 2 diabetes mellitus with other specified complication: Secondary | ICD-10-CM | POA: Diagnosis not present

## 2021-09-28 DIAGNOSIS — I10 Essential (primary) hypertension: Secondary | ICD-10-CM | POA: Diagnosis not present

## 2021-10-11 ENCOUNTER — Telehealth: Payer: Self-pay | Admitting: Pulmonary Disease

## 2021-10-11 DIAGNOSIS — R911 Solitary pulmonary nodule: Secondary | ICD-10-CM

## 2021-10-11 NOTE — Telephone Encounter (Signed)
I got  a call from [facility] and they want to verify if you meant to have his CT with or without contrast? They are not able to do both at the same time. Please advise.

## 2021-10-12 NOTE — Telephone Encounter (Signed)
I have placed a new order for this patient. I have let Elvina Sidle know about the order. Nothing further needed.

## 2021-10-16 ENCOUNTER — Ambulatory Visit (HOSPITAL_COMMUNITY)
Admission: RE | Admit: 2021-10-16 | Discharge: 2021-10-16 | Disposition: A | Discharge status: Home / Self Care | Payer: Medicare Other | Source: Ambulatory Visit | Attending: Pulmonary Disease | Admitting: Pulmonary Disease

## 2021-10-16 DIAGNOSIS — I700 Atherosclerosis of aorta: Secondary | ICD-10-CM | POA: Diagnosis not present

## 2021-10-16 DIAGNOSIS — R911 Solitary pulmonary nodule: Secondary | ICD-10-CM | POA: Diagnosis not present

## 2021-10-16 DIAGNOSIS — I2510 Atherosclerotic heart disease of native coronary artery without angina pectoris: Secondary | ICD-10-CM | POA: Diagnosis not present

## 2021-10-16 DIAGNOSIS — K760 Fatty (change of) liver, not elsewhere classified: Secondary | ICD-10-CM | POA: Insufficient documentation

## 2021-10-16 DIAGNOSIS — J439 Emphysema, unspecified: Secondary | ICD-10-CM | POA: Insufficient documentation

## 2021-10-18 ENCOUNTER — Encounter: Payer: Self-pay | Admitting: Pulmonary Disease

## 2021-10-18 ENCOUNTER — Ambulatory Visit: Payer: Medicare Other | Admitting: Pulmonary Disease

## 2021-10-18 VITALS — BP 130/80 | HR 85 | Ht 69.0 in | Wt 204.4 lb

## 2021-10-18 DIAGNOSIS — Z87891 Personal history of nicotine dependence: Secondary | ICD-10-CM

## 2021-10-18 DIAGNOSIS — R948 Abnormal results of function studies of other organs and systems: Secondary | ICD-10-CM | POA: Diagnosis not present

## 2021-10-18 DIAGNOSIS — R911 Solitary pulmonary nodule: Secondary | ICD-10-CM

## 2021-10-18 DIAGNOSIS — J432 Centrilobular emphysema: Secondary | ICD-10-CM | POA: Diagnosis not present

## 2021-10-18 DIAGNOSIS — Z8616 Personal history of COVID-19: Secondary | ICD-10-CM

## 2021-10-18 NOTE — Patient Instructions (Signed)
Thank you for visiting Dr. Valeta Harms at [facility]. Today we recommend the following:  Orders Placed This Encounter  Procedures   CT Super D Chest Wo Contrast   Return in about 6 months (around 04/18/2022) for with Eric Form, NP, or Dr. Valeta Harms.    Please do your part to reduce the spread of COVID-19.

## 2021-10-18 NOTE — Progress Notes (Signed)
Synopsis: Referred in Oct 2022 for lung nodule by [name], MD  Subjective:   PATIENT ID: Tristan Sims GENDER: male DOB: 12-24-1945, MRN: [id number]  Chief Complaint  Patient presents with   Follow-up    This is a 75 year old gentleman, past medical history of diabetes, hypertension, hypercholesterolemia and tobacco use.  He quit smoking in 2019.  Had a 50+ pack year history of smoking.  He is originally from [place].  He is still working in [employer].  He had a lung cancer screening CT that showed a slowly enlarging right lower lobe pulmonary nodule.  This went from a subsolid state to a more solid state concerning for a primary bronchogenic carcinoma.  Patient has had a PET scan complete with no other distant metastasis.  As well as pulmonary function test which revealed normal spirometry, no significant obstruction Ratio of 75, FEV1 of 2.6 L, TLC 77%, DLCO 76% predicted.  Patient from a respiratory standpoint has no significant complaints and is able to complete all of his activities of daily living.  He does have a planned trip coming up at the beginning of December to visit family in [place] for approximately 1 month.  OV 03/20/2021: Here today for follow-up postoperative from robotic assisted navigational bronchoscopy.  Patient was initially planned as a single anesthetic event, combination case with myself and Dr. Kipp Brood.  Unfortunately right before this the patient had a new diagnosis of COVID-19.  He is super D CT imaging also showed peripheral infiltrates.  At the time of induction and the start of the bronchoscopic portion of the procedure the patient had significant oxygenation issues and hypoxemia.  The procedure was also limited on three-dimensional CT imaging as to significant infiltrates present and areas of atelectasis unable to complete adequate tissue sampling and biopsy.  The procedure then was therefore aborted and no plans for lobectomy.  Here today to  discuss next steps on consideration for rescheduling.  Unfortunately still has some ongoing cough and shortness of breath.  He does feel better.  His wife is also slowly recovering.  OV 04/19/2021: Patient here today for follow-up.  Patient had recentCT scan of the chest was completed on 03/29/2021 which was completed for evaluation of a right lower lobe follow-up.  Right lower lobe pulmonary nodule that was being followed has now decreased in size measuring approximately 8 mm.  His infiltrates that were located in the periphery related to his COVID-19 infection have also resolved.  Overall the patient is doing much better from a respiratory standpoint.  Overall has no complaints.  Off prednisone.  We did review his nuclear medicine PET scan which showed a hypermetabolic lesion in the prostate gland.  We talked about the significance of this today in the office as well  OV 10/18/2021: Here today for follow-up.CT was complete yesterday.  Evidence of emphysema.  Right lower lobe pulmonary nodule 8 mm in size.  Slowly increased in size since 2019.  Indolent neoplasm not excluded.  Patient seen today for follow-up.  We reviewed his CT imaging in the office.  He has no respiratory complaints today.    Past Medical History:  Diagnosis Date   Adenomatous polyps    Ascending aortic aneurysm (HCC)    CAD (coronary artery disease)    DM (diabetes mellitus), type 2 (HCC)    HTN (hypertension)    Hypercholesteremia    Tobacco dependence      Family History  Problem Relation Age of Onset   Healthy  Mother    Healthy Father    Diabetes Neg Hx      Past Surgical History:  Procedure Laterality Date   EYE SURGERY Bilateral    cataract removal   LACERATION REPAIR Left 2002   hand   VIDEO BRONCHOSCOPY WITH ENDOBRONCHIAL NAVIGATION N/A 02/16/2021   Procedure: VIDEO BRONCHOSCOPY WITH ENDOBRONCHIAL NAVIGATION;  Surgeon: [name], DO;  Location: [place];  Service: Pulmonary;  Laterality: N/A;  ION  w/ CIOS    Social History   Socioeconomic History   Marital status: Married    Spouse name: [name]   Number of children: 4   Years of education: Not on file   Highest education level: Not on file  Occupational History   Not on file  Tobacco Use   Smoking status: Former    Packs/day: 2.00    Years: 53.00    Total pack years: 106.00    Types: Cigarettes    Quit date: 2019    Years since quitting: 4.7   Smokeless tobacco: Never   Tobacco comments:    Counseled about need to quit smoking  Vaping Use   Vaping Use: Never used  Substance and Sexual Activity   Alcohol use: Not Currently   Drug use: Never   Sexual activity: Not Currently  Other Topics Concern   Not on file  Social History Narrative   Not on file   Social Determinants of Health   Financial Resource Strain: Not on file  Food Insecurity: Not on file  Transportation Needs: Not on file  Physical Activity: Not on file  Stress: Not on file  Social Connections: Not on file  Intimate Partner Violence: Not on file     Allergies  Allergen Reactions   Lisinopril Other (See Comments)    Unsure of reaction    Penicillins Hives   Prednisone Other (See Comments)    Unsure of reaction    Aspirin Hives     Outpatient Medications Prior to Visit  Medication Sig Dispense Refill   atorvastatin (LIPITOR) 10 MG tablet Take 10 mg by mouth daily.     Blood Glucose Monitoring Suppl (CONTOUR NEXT ONE) DEVI 1 Device by Does not apply route daily. 1 each 0   glucose blood (ONETOUCH VERIO) test strip 1 each by Other route daily. And lancets 1/day 100 each 3   irbesartan (AVAPRO) 75 MG tablet Take 75 mg by mouth daily.     JARDIANCE 25 MG TABS tablet Take 1 tablet (25 mg total) by mouth daily. 90 tablet 3   metFORMIN (GLUCOPHAGE-XR) 500 MG 24 hr tablet Take 2 tablets (1,000 mg total) by mouth daily. 180 tablet 3   pioglitazone (ACTOS) 45 MG tablet Take 1 tablet (45 mg total) by mouth daily. 90 tablet 1   No  facility-administered medications prior to visit.    Review of Systems  Constitutional:  Negative for chills, fever, malaise/fatigue and weight loss.  HENT:  Negative for hearing loss, sore throat and tinnitus.   Eyes:  Negative for blurred vision and double vision.  Respiratory:  Negative for cough, hemoptysis, sputum production, shortness of breath, wheezing and stridor.   Cardiovascular:  Negative for chest pain, palpitations, orthopnea, leg swelling and PND.  Gastrointestinal:  Negative for abdominal pain, constipation, diarrhea, heartburn, nausea and vomiting.  Genitourinary:  Negative for dysuria, hematuria and urgency.  Musculoskeletal:  Negative for joint pain and myalgias.  Skin:  Negative for itching and rash.  Neurological:  Negative for dizziness, tingling,  weakness and headaches.  Endo/Heme/Allergies:  Negative for environmental allergies. Does not bruise/bleed easily.  Psychiatric/Behavioral:  Negative for depression. The patient is not nervous/anxious and does not have insomnia.   All other systems reviewed and are negative.    Objective:  Physical Exam Vitals reviewed.  Constitutional:      General: He is not in acute distress.    Appearance: He is well-developed.  HENT:     Head: Normocephalic and atraumatic.  Eyes:     General: No scleral icterus.    Conjunctiva/sclera: Conjunctivae normal.     Pupils: Pupils are equal, round, and reactive to light.  Neck:     Vascular: No JVD.     Trachea: No tracheal deviation.  Cardiovascular:     Rate and Rhythm: Normal rate and regular rhythm.     Heart sounds: Normal heart sounds. No murmur heard. Pulmonary:     Effort: Pulmonary effort is normal. No tachypnea, accessory muscle usage or respiratory distress.     Breath sounds: No stridor. No wheezing, rhonchi or rales.     Comments: Diminished breath sounds bilaterally Abdominal:     General: There is no distension.     Palpations: Abdomen is soft.     Tenderness:  There is no abdominal tenderness.  Musculoskeletal:        General: No tenderness.     Cervical back: Neck supple.  Lymphadenopathy:     Cervical: No cervical adenopathy.  Skin:    General: Skin is warm and dry.     Capillary Refill: Capillary refill takes less than 2 seconds.     Findings: No rash.  Neurological:     Mental Status: He is alert and oriented to person, place, and time.  Psychiatric:        Behavior: Behavior normal.      Vitals:   10/18/21 0906  BP: 130/80  Pulse: 85  SpO2: 91%  Weight: 204 lb 6.4 oz (92.7 kg)  Height: '5\' 9"'$  (1.753 m)     91% on RA BMI Readings from Last 3 Encounters:  10/18/21 30.18 kg/m  08/11/21 28.91 kg/m  06/12/21 28.68 kg/m   Wt Readings from Last 3 Encounters:  10/18/21 204 lb 6.4 oz (92.7 kg)  08/11/21 195 lb 12.8 oz (88.8 kg)  06/12/21 194 lb 3.2 oz (88.1 kg)     CBC    Component Value Date/Time   WBC 6.2 02/15/2021 1000   RBC 4.68 02/15/2021 1000   HGB 14.6 02/15/2021 1000   HCT 44.1 02/15/2021 1000   PLT 245 02/15/2021 1000   MCV 94.2 02/15/2021 1000   MCH 31.2 02/15/2021 1000   MCHC 33.1 02/15/2021 1000   RDW 12.6 02/15/2021 1000      Chest Imaging:  10/31/2020 LDCT: Longstanding history of smoking, more solid enlarging right lower lobe pulmonary nodule concerning for malignancy. The patient's images have been independently reviewed by me.    January 2023 CT chest: 9 mm pulmonary nodule right lower lobe stable in comparison to previous imaging. Peripheral groundglass opacities consistent with recent COVID-19 diagnosis. The patient's images have been independently reviewed by me.     Pulmonary Functions Testing Results:    Latest Ref Rng & Units 11/22/2020    3:50 PM  PFT Results  FVC-Pre L 3.23   FVC-Predicted Pre % 75   FVC-Post L 3.45   FVC-Predicted Post % 80   Pre FEV1/FVC % % 76   Post FEV1/FCV % % 75   FEV1-Pre  L 2.44   FEV1-Predicted Pre % 78   FEV1-Post L 2.60   DLCO uncorrected  ml/min/mmHg 19.39   DLCO UNC% % 76   DLCO corrected ml/min/mmHg 19.39   DLCO COR %Predicted % 76   DLVA Predicted % 92   TLC L 5.43   TLC % Predicted % 77   RV % Predicted % 85     FeNO:   Pathology:   Echocardiogram:   Heart Catheterization:     Assessment & Plan:     ICD-10-CM   1. Right lower lobe pulmonary nodule  R91.1 CT Super D Chest Wo Contrast    2. Abnormal positron emission tomography (PET) scan  R94.8     3. Former smoker  Z87.891     4. Centrilobular emphysema ([place])  J43.2     5. History of COVID-19  Z86.16       Discussion:  This is a 76 year old gentleman, longstanding history of tobacco use, 100+ pack year history, originally from [place], quit smoking in 2019.  PFTs with no significant obstructive defect relatively normal spirometry.  He has a right lower lobe pulmonary nodule that we have been following for some time.  It is still persistent.  It has been stable since his last 68-monthfollow-up CT.  Plan: We will have a repeat surveillance CT in 6 months. He would like to continue to follow the nodule for the time being. I did explain that if the nodule stays persistent or its solid component becomes larger that it does increase the risk that this could be a malignancy. Patient understands and we will continue to watch for the next 6 months. I did explain that if the nodule does get larger in size we could consider biopsy and referral for radiation if it was malignant as he has clearly stated he is not sure that he would want to have surgery.    Current Outpatient Medications:    atorvastatin (LIPITOR) 10 MG tablet, Take 10 mg by mouth daily., Disp: , Rfl:    Blood Glucose Monitoring Suppl (CONTOUR NEXT ONE) DEVI, 1 Device by Does not apply route daily., Disp: 1 each, Rfl: 0   glucose blood (ONETOUCH VERIO) test strip, 1 each by Other route daily. And lancets 1/day, Disp: 100 each, Rfl: 3   irbesartan (AVAPRO) 75 MG tablet, Take 75 mg by mouth  daily., Disp: , Rfl:    JARDIANCE 25 MG TABS tablet, Take 1 tablet (25 mg total) by mouth daily., Disp: 90 tablet, Rfl: 3   metFORMIN (GLUCOPHAGE-XR) 500 MG 24 hr tablet, Take 2 tablets (1,000 mg total) by mouth daily., Disp: 180 tablet, Rfl: 3   pioglitazone (ACTOS) 45 MG tablet, Take 1 tablet (45 mg total) by mouth daily., Disp: 90 tablet, Rfl: 1   BGarner Nash DO [facility] 10/18/2021 9:35 AM

## 2021-10-24 DIAGNOSIS — Z03818 Encounter for observation for suspected exposure to other biological agents ruled out: Secondary | ICD-10-CM | POA: Diagnosis not present

## 2021-10-24 DIAGNOSIS — R051 Acute cough: Secondary | ICD-10-CM | POA: Diagnosis not present

## 2021-10-24 DIAGNOSIS — J219 Acute bronchiolitis, unspecified: Secondary | ICD-10-CM | POA: Diagnosis not present

## 2021-12-14 ENCOUNTER — Other Ambulatory Visit: Payer: Self-pay

## 2021-12-14 MED ORDER — PIOGLITAZONE HCL 45 MG PO TABS: 45.0000 mg | ORAL_TABLET | Freq: Every day | ORAL | 0 refills | Status: AC

## 2021-12-22 IMAGING — CT CT CHEST LUNG CANCER SCREENING LOW DOSE W/O CM
3 of 5 series · 13 of 40 positions shown, 14 images · non-contrast
Comparison: 09/14/2019

CLINICAL DATA: 109 pack-year smoking history.  Quit 2 years ago.

EXAM:
CT CHEST WITHOUT CONTRAST LOW-DOSE FOR LUNG CANCER SCREENING
TECHNIQUE: Multidetector CT imaging of the chest was performed following the
standard protocol without IV contrast.

[Series 2: lung 5.00 br40 axial · axial · 0.60mm/px · z∈[-964,-864]mm · 2 of 62 slices shown, 3 images]
[im 21/62  mediastinal]
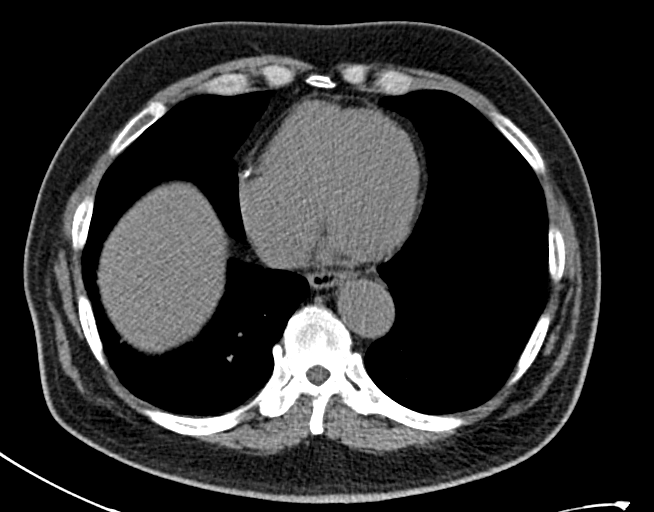
[im 21/62  lung]
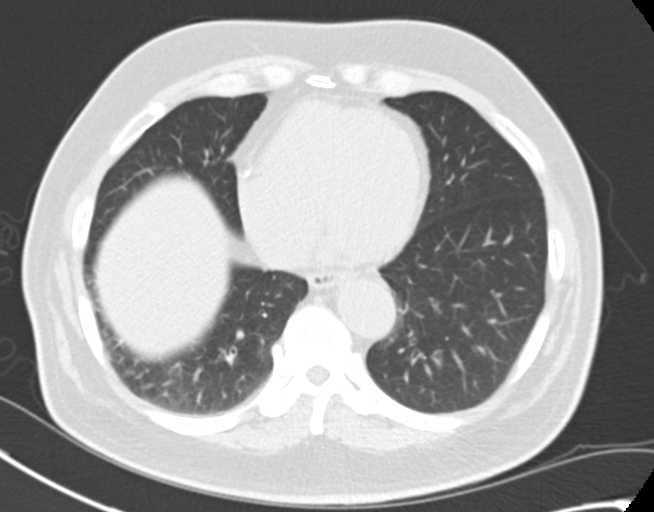
[im 41/62  lung]
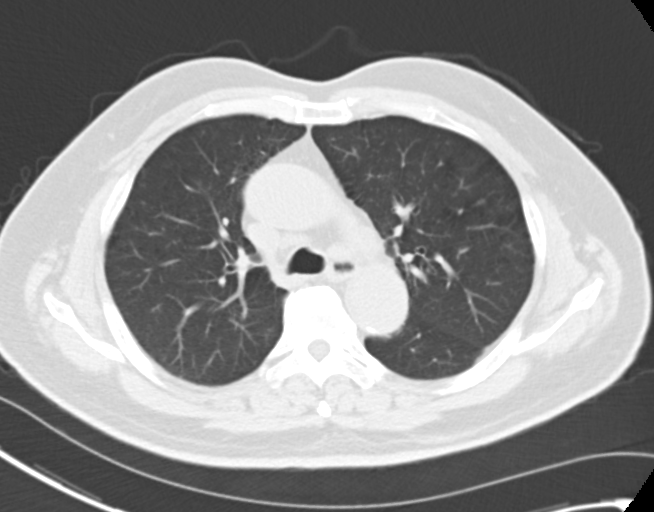

[Series 4: lung 1.00 br44 cor · coronal · 0.61mm/px · 3 of 391 slices shown]
[im 79/391  lung]
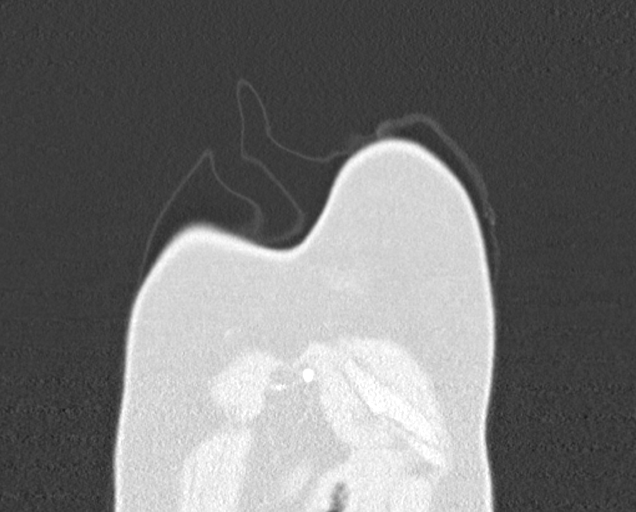
[im 157/391  lung]
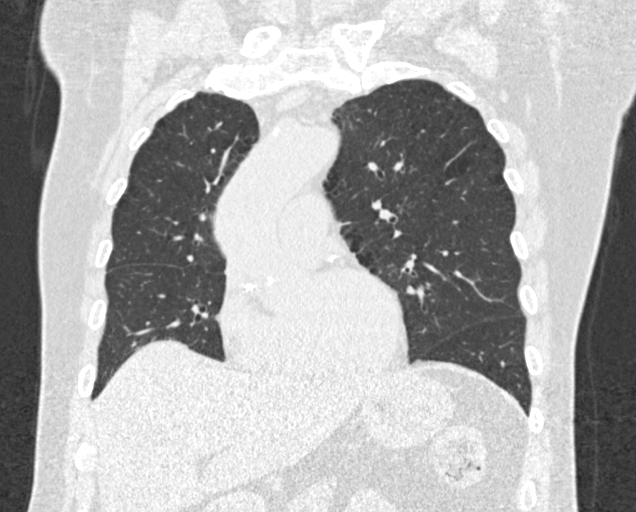
[im 235/391  lung]
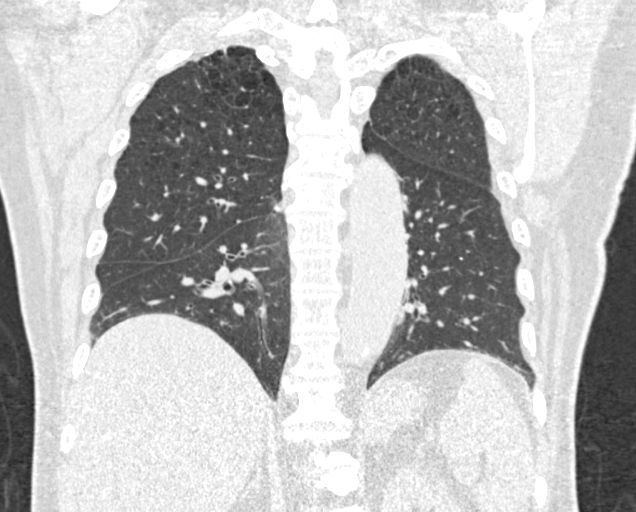

[Series 9: lung 1.00 br60 axial · axial · 0.76mm/px · z∈[-993,-781]mm · 8 of 260 slices shown]
[im 24/260  lung]
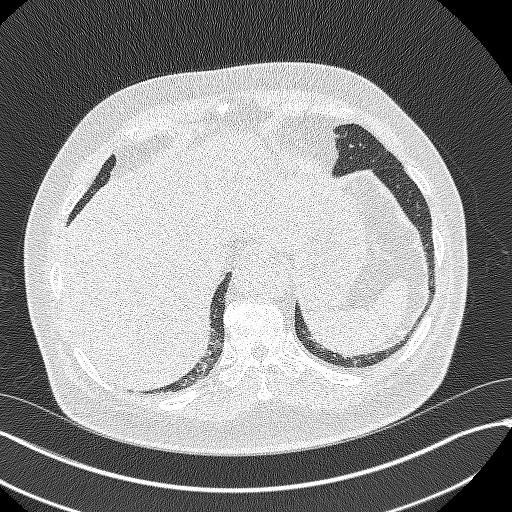
[im 48/260  lung]
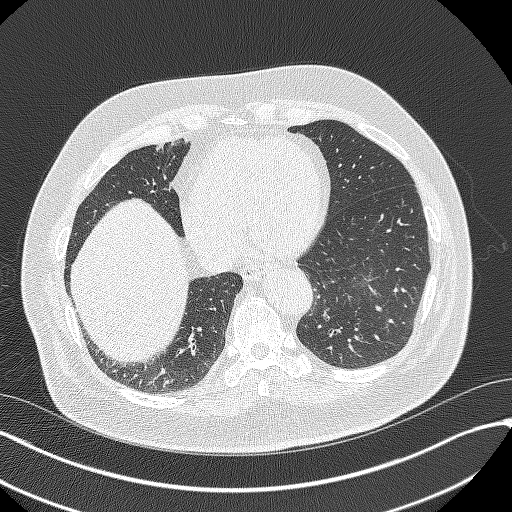
[im 83/260  lung]
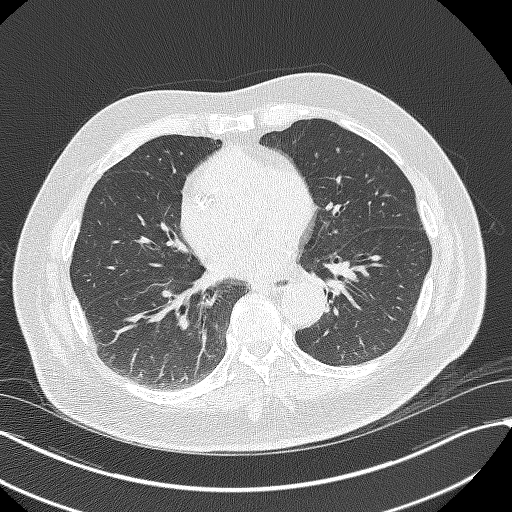
[im 118/260  lung]
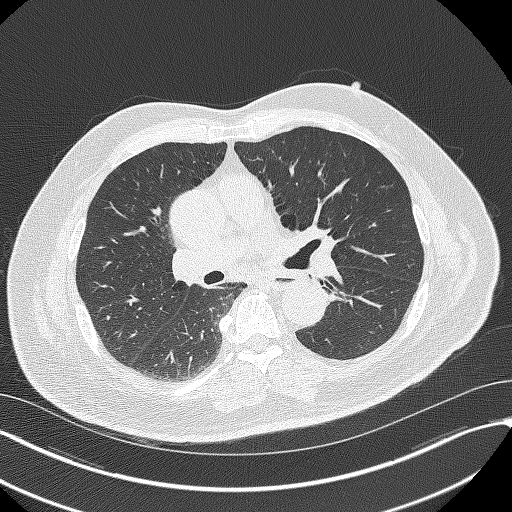
[im 142/260  lung]
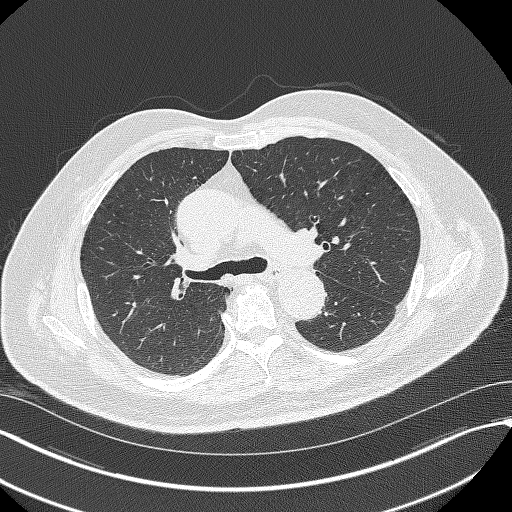
[im 177/260  lung]
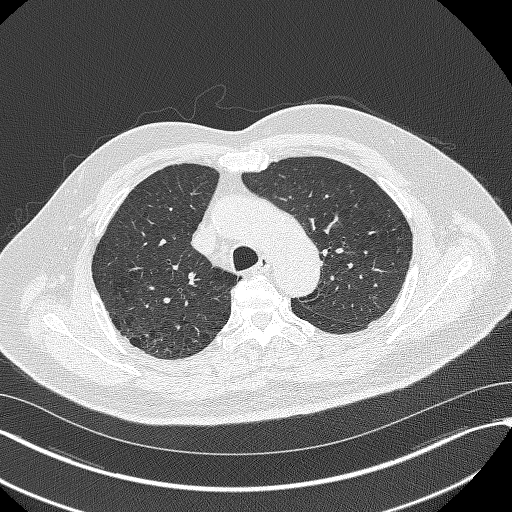
[im 212/260  lung]
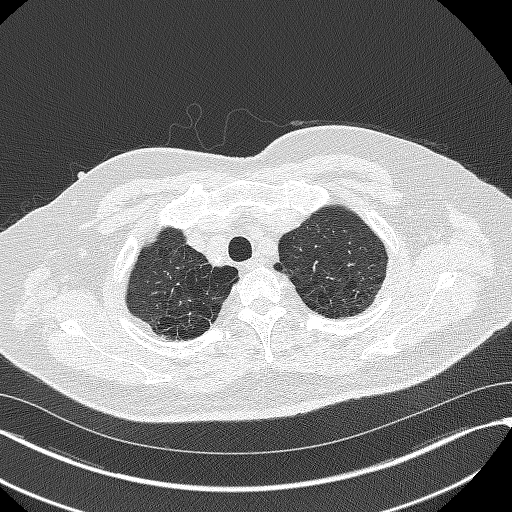
[im 236/260  lung]
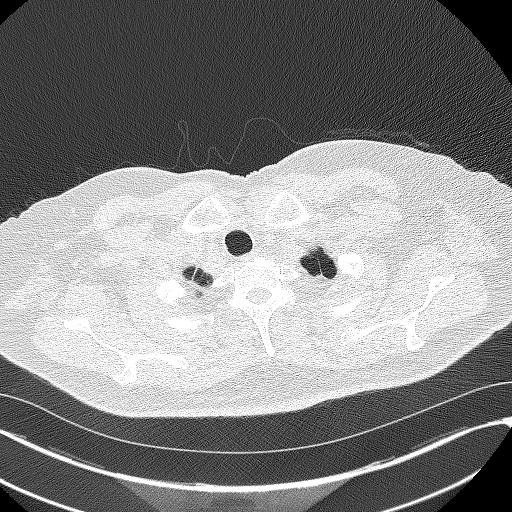

[13 of 40 positions shown; findings below may reference images not displayed]

FINDINGS: Cardiovascular: Aortic atherosclerosis. Upper normal ascending
aortic size, 3.8 cm. Tortuous thoracic aorta. Normal heart size,
without pericardial effusion. Three vessel coronary artery
calcification.

Mediastinum/Nodes: No mediastinal or definite hilar adenopathy,
given limitations of unenhanced CT.

Lungs/Pleura: No pleural fluid. Moderate centrilobular and
paraseptal emphysema. Secretions within the trachea and dependent
right mainstem bronchus.

Tiny calcified and noncalcified pulmonary nodules are again
identified, including at maximally volume derived equivalent
diameter 6.3 mm in the left upper lobe.

A solid right lower lobe pulmonary nodule of volume derived
equivalent diameter 9.2 mm including on 169/9 has enlarged from
volume derived equivalent diameter 7.2 mm on the prior exam. This
has been present back to 8789, where it measured volume derived
equivalent diameter 5.8 mm.

Upper Abdomen: Mild hepatic steatosis. Right hepatic lobe 1.6 cm
cyst again identified. Normal imaged portions of the spleen,
stomach, pancreas, gallbladder, adrenal glands, left kidney.

Musculoskeletal: No acute osseous abnormality.
IMPRESSION: 1. Lung-RADS 4B, suspicious. Additional imaging evaluation or
consultation with Pulmonology or Thoracic Surgery recommended.
Slowly enlarging right lower lobe pulmonary nodule is suspicious for
indolent primary bronchogenic carcinoma.
2. No thoracic adenopathy.
3. Aortic atherosclerosis (4H4HN-8IT.T), coronary artery
atherosclerosis and emphysema (4H4HN-JT2.G).
4. Hepatic steatosis.

These results will be called to the ordering clinician or
representative by the Radiologist Assistant, and communication
documented in the PACS or [REDACTED].

## 2022-01-09 ENCOUNTER — Telehealth: Payer: Self-pay

## 2022-01-09 NOTE — Telephone Encounter (Signed)
..     Pre-operative Risk Assessment    Patient Name: Tristan Sims  DOB: [birth year] MRN: [id number]      Request for Surgical Clearance    Procedure:   COLONOSCOPY  Date of Surgery:  Clearance 02/06/22                                 Surgeon:  DR Ronnette Juniper Surgeon's Group or Practice Name:  [facility] Phone number:  [phone] Fax number:  [phone]   Type of Clearance Requested:   - Medical    Type of Anesthesia:   PROPOFOL   Additional requests/questions:    Signed, Merry Lofty   01/09/2022, 2:20 PM

## 2022-01-09 NOTE — Telephone Encounter (Signed)
   Name: Tristan Sims  DOB: 1945-11-21  MRN: [id number]  Primary Cardiologist: Mertie Moores, MD  Since medical clearance is being requested, needs tele visit (last in 08/2021). Preoperative team, please contact this patient and set up a phone call appointment for further preoperative risk assessment. Please obtain consent and complete medication review. Thank you for your help.  No meds listed to hold.   Charlie Pitter, PA-C 01/09/2022, 4:27 PM [place]

## 2022-01-10 ENCOUNTER — Telehealth: Payer: Self-pay | Admitting: *Deleted

## 2022-01-10 NOTE — Telephone Encounter (Signed)
I s/w the pt and his wife who agree to tele pre op appt 01/11/22 @ 2:20. Med rec and consent are done.     Patient Consent for Virtual Visit        Tristan Sims has provided verbal consent on 01/10/2022 for a virtual visit (video or telephone).   CONSENT FOR VIRTUAL VISIT FOR:  Tristan Sims  By participating in this virtual visit I agree to the following:  I hereby voluntarily request, consent and authorize [facility] and its employed or contracted physicians, physician assistants, nurse practitioners or other licensed health care professionals (the Practitioner), to provide me with telemedicine health care services (the "Services") as deemed necessary by the treating Practitioner. I acknowledge and consent to receive the Services by the Practitioner via telemedicine. I understand that the telemedicine visit will involve communicating with the Practitioner through live audiovisual communication technology and the disclosure of certain medical information by electronic transmission. I acknowledge that I have been given the opportunity to request an in-person assessment or other available alternative prior to the telemedicine visit and am voluntarily participating in the telemedicine visit.  I understand that I have the right to withhold or withdraw my consent to the use of telemedicine in the course of my care at any time, without affecting my right to future care or treatment, and that the Practitioner or I may terminate the telemedicine visit at any time. I understand that I have the right to inspect all information obtained and/or recorded in the course of the telemedicine visit and may receive copies of available information for a reasonable fee.  I understand that some of the potential risks of receiving the Services via telemedicine include:  Delay or interruption in medical evaluation due to technological equipment failure or disruption; Information transmitted may not be sufficient  (e.g. poor resolution of images) to allow for appropriate medical decision making by the Practitioner; and/or  In rare instances, security protocols could fail, causing a breach of personal health information.  Furthermore, I acknowledge that it is my responsibility to provide information about my medical history, conditions and care that is complete and accurate to the best of my ability. I acknowledge that Practitioner's advice, recommendations, and/or decision may be based on factors not within their control, such as incomplete or inaccurate data provided by me or distortions of diagnostic images or specimens that may result from electronic transmissions. I understand that the practice of medicine is not an exact science and that Practitioner makes no warranties or guarantees regarding treatment outcomes. I acknowledge that a copy of this consent can be made available to me via my patient portal ([facility]), or I can request a printed copy by calling the office of [facility].    I understand that my insurance will be billed for this visit.   I have read or had this consent read to me. I understand the contents of this consent, which adequately explains the benefits and risks of the Services being provided via telemedicine.  I have been provided ample opportunity to ask questions regarding this consent and the Services and have had my questions answered to my satisfaction. I give my informed consent for the services to be provided through the use of telemedicine in my medical care

## 2022-01-10 NOTE — Telephone Encounter (Signed)
I s/w the pt and his wife who agree to tele pre op appt 01/11/22 @ 2:20. Med rec and consent are done.

## 2022-01-11 ENCOUNTER — Telehealth: Payer: Self-pay | Admitting: Pulmonary Disease

## 2022-01-11 ENCOUNTER — Ambulatory Visit (INDEPENDENT_AMBULATORY_CARE_PROVIDER_SITE_OTHER): Payer: Medicare Other | Admitting: Nurse Practitioner

## 2022-01-11 DIAGNOSIS — Z01810 Encounter for preprocedural cardiovascular examination: Secondary | ICD-10-CM

## 2022-01-11 NOTE — Telephone Encounter (Signed)
Fax received from Dr. Ronnette Juniper with [facility] to perform a Colonoscopy with propofol on patient.  Patient needs surgery clearance. Surgery is 02/06/2022. Patient was seen on 10/18/2021. Office protocol is a risk assessment can be sent to surgeon if patient has been seen in 60 days or less.   Sending to Dr Valeta Harms for risk assessment or recommendations if patient needs to be seen in office prior to surgical procedure.

## 2022-01-11 NOTE — Progress Notes (Signed)
   Virtual Visit via Telephone Note   Because of Tristan Sims's co-morbid illnesses, he is at least at moderate risk for complications without adequate follow up.  This format is felt to be most appropriate for this patient at this time.  The patient did not have access to video technology/had technical difficulties with video requiring transitioning to audio format only (telephone).  All issues noted in this document were discussed and addressed.  No physical exam could be performed with this format.  Please refer to the patient's chart for his consent to telehealth for [facility].  Evaluation Performed:  Preoperative cardiovascular risk assessment _____________   Date:  01/11/2022   Patient ID:  Tristan Sims, DOB 11-16-1945, MRN [id number] Patient Location:  Home Provider location:   Office  Primary Care Provider:  [name], MD Primary Cardiologist:  Mertie Moores, MD  Chief Complaint / Patient Profile   76 y.o. y/o male with a h/o coronary artery calcification, ascending aortic aneurysm, hypertension, hyperlipidemia, and type 2 diabetes who is pending colonoscopy on 02/06/2022 with Dr. Ronnette Juniper of [facility] and presents today for telephonic preoperative cardiovascular risk assessment.  Attempted to contact patient with no answer.  Patient's wife answered home phone and stated she was unable to reach patient by phone, pt not at home  She  asked to reschedule his appointment.  Will notify preop coverage team to contact patient's wife to reschedule the appointment.   Lenna Sciara, NP  01/11/2022, 2:19 PM

## 2022-01-12 NOTE — Telephone Encounter (Signed)
OV notes and clearance form have been faxed back to [facility]. Nothing further needed at this time.

## 2022-01-16 DIAGNOSIS — I2510 Atherosclerotic heart disease of native coronary artery without angina pectoris: Secondary | ICD-10-CM | POA: Diagnosis not present

## 2022-01-16 DIAGNOSIS — K760 Fatty (change of) liver, not elsewhere classified: Secondary | ICD-10-CM | POA: Diagnosis not present

## 2022-01-16 DIAGNOSIS — I10 Essential (primary) hypertension: Secondary | ICD-10-CM | POA: Diagnosis not present

## 2022-01-16 DIAGNOSIS — E1165 Type 2 diabetes mellitus with hyperglycemia: Secondary | ICD-10-CM | POA: Diagnosis not present

## 2022-01-17 ENCOUNTER — Ambulatory Visit: Payer: Medicare Other | Attending: Cardiovascular Disease | Admitting: Physician Assistant

## 2022-01-17 ENCOUNTER — Telehealth: Payer: Medicare Other

## 2022-01-17 DIAGNOSIS — Z01810 Encounter for preprocedural cardiovascular examination: Secondary | ICD-10-CM

## 2022-01-17 NOTE — Progress Notes (Signed)
Virtual Visit via Telephone Note   Because of Tristan Sims's co-morbid illnesses, he is at least at moderate risk for complications without adequate follow up.  This format is felt to be most appropriate for this patient at this time.  The patient did not have access to video technology/had technical difficulties with video requiring transitioning to audio format only (telephone).  All issues noted in this document were discussed and addressed.  No physical exam could be performed with this format.  Please refer to the patient's chart for his consent to telehealth for [facility].  Evaluation Performed:  Preoperative cardiovascular risk assessment _____________   Date:  01/17/2022   Patient ID:  Tristan Sims, DOB 1945/12/23, MRN [id number] Patient Location:  Home Provider location:   Office  Primary Care Provider:  [name], MD Primary Cardiologist:  Mertie Moores, MD  Chief Complaint / Patient Profile   76 y.o. y/o male with a h/o coronary artery calcification, ascending aortic aneurysm, hypertension, hyperlipidemia, and type 2 diabetes mellitus who is pending colonoscopy 02/06/2022 and presents today for telephonic preoperative cardiovascular risk assessment.  History of Present Illness    Tristan Sims is a 76 y.o. male who presents via audio/video conferencing for a telehealth visit today.  Pt was last seen in cardiology clinic on 08/11/21 by Dr. Acie Fredrickson.  At that time Tristan Sims was doing well.  The patient is now pending procedure as outlined above. Since his last visit, he has been doing really well without any heart issues.  He denies chest pain, shortness of breath, and heart palpitations.  He states he walks a couple of miles at a time without difficulty.  He also enjoys playing pool for 4 to 5 hours sometimes in a row.  He also jokes with me today about fighting with his wife as an activity.  He has scored a 5.07 METS on the DASI.  This exceeds the 4 METS minimum  requirement.  No medications indicated as needing held.  Past Medical History    Past Medical History:  Diagnosis Date   Adenomatous polyps    Ascending aortic aneurysm (HCC)    CAD (coronary artery disease)    DM (diabetes mellitus), type 2 (HCC)    HTN (hypertension)    Hypercholesteremia    Tobacco dependence    Past Surgical History:  Procedure Laterality Date   EYE SURGERY Bilateral    cataract removal   LACERATION REPAIR Left 2002   hand   VIDEO BRONCHOSCOPY WITH ENDOBRONCHIAL NAVIGATION N/A 02/16/2021   Procedure: VIDEO BRONCHOSCOPY WITH ENDOBRONCHIAL NAVIGATION;  Surgeon: [name], DO;  Location: [place];  Service: Pulmonary;  Laterality: N/A;  ION w/ CIOS    Allergies  Allergies  Allergen Reactions   Lisinopril Other (See Comments)    Unsure of reaction    Penicillins Hives   Prednisone Other (See Comments)    Unsure of reaction    Aspirin Hives    Home Medications    Prior to Admission medications   Medication Sig Start Date End Date Taking? Authorizing Provider  atorvastatin (LIPITOR) 10 MG tablet Take 10 mg by mouth daily.    [provider]  Blood Glucose Monitoring Suppl (CONTOUR NEXT ONE) DEVI 1 Device by Does not apply route daily. 04/11/21   Renato Shin, MD  glucose blood [facility] VERIO) test strip 1 each by Other route daily. And lancets 1/day 06/12/21   Renato Shin, MD  irbesartan (AVAPRO) 75 MG tablet Take 75 mg  by mouth daily.    [provider]  JARDIANCE 25 MG TABS tablet Take 1 tablet (25 mg total) by mouth daily. 09/27/20   Renato Shin, MD  metFORMIN (GLUCOPHAGE-XR) 500 MG 24 hr tablet Take 2 tablets (1,000 mg total) by mouth daily. 04/11/21   Renato Shin, MD  pioglitazone (ACTOS) 45 MG tablet Take 1 tablet (45 mg total) by mouth daily. 12/14/21   Shamleffer, Melanie Crazier, MD    Physical Exam    Vital Signs:  Tristan Sims does not have vital signs available for review today.  Given telephonic nature of  communication, physical exam is limited. AAOx3. NAD. Normal affect.  Speech and respirations are unlabored.  Accessory Clinical Findings    None  Assessment & Plan    1.  Preoperative Cardiovascular Risk Assessment:  Tristan Sims perioperative risk of a major cardiac event is 0.9% according to the Revised Cardiac Risk Index (RCRI).  Therefore, he is at low risk for perioperative complications.   His functional capacity is good at 5.07 METs according to the [facility] Activity Status Index (DASI). Recommendations: According to ACC/AHA guidelines, no further cardiovascular testing needed.  The patient may proceed to surgery at acceptable risk.     The patient was advised that if he develops new symptoms prior to surgery to contact our office to arrange for a follow-up visit, and he verbalized understanding.  A copy of this note will be routed to requesting surgeon.  Time:   Today, I have spent 8 minutes with the patient with telehealth technology discussing medical history, symptoms, and management plan.     Elgie Collard, PA-C  01/17/2022, 8:11 AM

## 2022-01-26 DIAGNOSIS — E782 Mixed hyperlipidemia: Secondary | ICD-10-CM | POA: Diagnosis not present

## 2022-01-26 DIAGNOSIS — I10 Essential (primary) hypertension: Secondary | ICD-10-CM | POA: Diagnosis not present

## 2022-01-26 DIAGNOSIS — E1165 Type 2 diabetes mellitus with hyperglycemia: Secondary | ICD-10-CM | POA: Diagnosis not present

## 2022-01-26 DIAGNOSIS — I2510 Atherosclerotic heart disease of native coronary artery without angina pectoris: Secondary | ICD-10-CM | POA: Diagnosis not present

## 2022-02-06 DIAGNOSIS — D128 Benign neoplasm of rectum: Secondary | ICD-10-CM | POA: Diagnosis not present

## 2022-02-06 DIAGNOSIS — K648 Other hemorrhoids: Secondary | ICD-10-CM | POA: Diagnosis not present

## 2022-02-06 DIAGNOSIS — D125 Benign neoplasm of sigmoid colon: Secondary | ICD-10-CM | POA: Diagnosis not present

## 2022-02-06 DIAGNOSIS — Z86010 Personal history of colonic polyps: Secondary | ICD-10-CM | POA: Diagnosis not present

## 2022-02-06 DIAGNOSIS — K5730 Diverticulosis of large intestine without perforation or abscess without bleeding: Secondary | ICD-10-CM | POA: Diagnosis not present

## 2022-02-06 DIAGNOSIS — Z09 Encounter for follow-up examination after completed treatment for conditions other than malignant neoplasm: Secondary | ICD-10-CM | POA: Diagnosis not present

## 2022-02-06 DIAGNOSIS — D123 Benign neoplasm of transverse colon: Secondary | ICD-10-CM | POA: Diagnosis not present

## 2022-02-08 DIAGNOSIS — D123 Benign neoplasm of transverse colon: Secondary | ICD-10-CM | POA: Diagnosis not present

## 2022-02-08 DIAGNOSIS — D128 Benign neoplasm of rectum: Secondary | ICD-10-CM | POA: Diagnosis not present

## 2022-02-12 DIAGNOSIS — H5203 Hypermetropia, bilateral: Secondary | ICD-10-CM | POA: Diagnosis not present

## 2022-03-11 ENCOUNTER — Other Ambulatory Visit: Payer: Self-pay | Admitting: Internal Medicine

## 2022-04-11 ENCOUNTER — Ambulatory Visit (HOSPITAL_BASED_OUTPATIENT_CLINIC_OR_DEPARTMENT_OTHER)
Admission: RE | Admit: 2022-04-11 | Discharge: 2022-04-11 | Disposition: A | Discharge status: Home / Self Care | Payer: Medicare Other | Source: Ambulatory Visit | Attending: Pulmonary Disease | Admitting: Pulmonary Disease

## 2022-04-11 ENCOUNTER — Ambulatory Visit (HOSPITAL_BASED_OUTPATIENT_CLINIC_OR_DEPARTMENT_OTHER): Admission: RE | Admit: 2022-04-11 | Payer: Medicare Other | Source: Ambulatory Visit

## 2022-04-11 DIAGNOSIS — Z125 Encounter for screening for malignant neoplasm of prostate: Secondary | ICD-10-CM | POA: Diagnosis not present

## 2022-04-11 DIAGNOSIS — Z0001 Encounter for general adult medical examination with abnormal findings: Secondary | ICD-10-CM | POA: Diagnosis not present

## 2022-04-11 DIAGNOSIS — J432 Centrilobular emphysema: Secondary | ICD-10-CM | POA: Diagnosis not present

## 2022-04-11 DIAGNOSIS — I10 Essential (primary) hypertension: Secondary | ICD-10-CM | POA: Diagnosis not present

## 2022-04-11 DIAGNOSIS — Z79899 Other long term (current) drug therapy: Secondary | ICD-10-CM | POA: Diagnosis not present

## 2022-04-11 DIAGNOSIS — R911 Solitary pulmonary nodule: Secondary | ICD-10-CM | POA: Insufficient documentation

## 2022-04-11 DIAGNOSIS — E1165 Type 2 diabetes mellitus with hyperglycemia: Secondary | ICD-10-CM | POA: Diagnosis not present

## 2022-04-11 DIAGNOSIS — E7800 Pure hypercholesterolemia, unspecified: Secondary | ICD-10-CM | POA: Diagnosis not present

## 2022-04-13 NOTE — Progress Notes (Signed)
Please schedule follow up appt (ok with virtual) with APP to discuss ct results and plan for next suggested follow up.   Thanks,  BLI  [name], DO [facility] 04/13/2022 8:37 AM

## 2022-04-15 NOTE — Progress Notes (Unsigned)
Synopsis: Referred in Oct 2022 for lung nodule by [name], MD  Subjective:   PATIENT ID: Tristan Sims GENDER: male DOB: 1945-11-28, MRN: [id number]  No chief complaint on file.   This is a 77 year old gentleman, past medical history of diabetes, hypertension, hypercholesterolemia and tobacco use.  He quit smoking in 2019.  Had a 50+ pack year history of smoking.  He is originally from [place].  He is still working in [employer].  He had a lung cancer screening CT that showed a slowly enlarging right lower lobe pulmonary nodule.  This went from a subsolid state to a more solid state concerning for a primary bronchogenic carcinoma.  Patient has had a PET scan complete with no other distant metastasis.  As well as pulmonary function test which revealed normal spirometry, no significant obstruction Ratio of 75, FEV1 of 2.6 L, TLC 77%, DLCO 76% predicted.  Patient from a respiratory standpoint has no significant complaints and is able to complete all of his activities of daily living.  He does have a planned trip coming up at the beginning of December to visit family in [place] for approximately 1 month.  OV 03/20/2021: Here today for follow-up postoperative from robotic assisted navigational bronchoscopy.  Patient was initially planned as a single anesthetic event, combination case with myself and Dr. Kipp Brood.  Unfortunately right before this the patient had a new diagnosis of COVID-19.  He is super D CT imaging also showed peripheral infiltrates.  At the time of induction and the start of the bronchoscopic portion of the procedure the patient had significant oxygenation issues and hypoxemia.  The procedure was also limited on three-dimensional CT imaging as to significant infiltrates present and areas of atelectasis unable to complete adequate tissue sampling and biopsy.  The procedure then was therefore aborted and no plans for lobectomy.  Here today to discuss next steps on  consideration for rescheduling.  Unfortunately still has some ongoing cough and shortness of breath.  He does feel better.  His wife is also slowly recovering.  OV 04/19/2021: Patient here today for follow-up.  Patient had recentCT scan of the chest was completed on 03/29/2021 which was completed for evaluation of a right lower lobe follow-up.  Right lower lobe pulmonary nodule that was being followed has now decreased in size measuring approximately 8 mm.  His infiltrates that were located in the periphery related to his COVID-19 infection have also resolved.  Overall the patient is doing much better from a respiratory standpoint.  Overall has no complaints.  Off prednisone.  We did review his nuclear medicine PET scan which showed a hypermetabolic lesion in the prostate gland.  We talked about the significance of this today in the office as well  OV 10/18/2021: Here today for follow-up.CT was complete yesterday.  Evidence of emphysema.  Right lower lobe pulmonary nodule 8 mm in size.  Slowly increased in size since 2019.  Indolent neoplasm not excluded.  Patient seen today for follow-up.  We reviewed his CT imaging in the office.  He has no respiratory complaints today.    Past Medical History:  Diagnosis Date   Adenomatous polyps    Ascending aortic aneurysm (HCC)    CAD (coronary artery disease)    DM (diabetes mellitus), type 2 (HCC)    HTN (hypertension)    Hypercholesteremia    Tobacco dependence      Family History  Problem Relation Age of Onset   Healthy Mother    Healthy  Father    Diabetes Neg Hx      Past Surgical History:  Procedure Laterality Date   EYE SURGERY Bilateral    cataract removal   LACERATION REPAIR Left 2002   hand   VIDEO BRONCHOSCOPY WITH ENDOBRONCHIAL NAVIGATION N/A 02/16/2021   Procedure: VIDEO BRONCHOSCOPY WITH ENDOBRONCHIAL NAVIGATION;  Surgeon: [name], DO;  Location: [place];  Service: Pulmonary;  Laterality: N/A;  ION w/ CIOS    Social  History   Socioeconomic History   Marital status: Married    Spouse name: [name]   Number of children: 4   Years of education: Not on file   Highest education level: Not on file  Occupational History   Not on file  Tobacco Use   Smoking status: Former    Packs/day: 2.00    Years: 53.00    Total pack years: 106.00    Types: Cigarettes    Quit date: 2019    Years since quitting: 5.1   Smokeless tobacco: Never   Tobacco comments:    Counseled about need to quit smoking  Vaping Use   Vaping Use: Never used  Substance and Sexual Activity   Alcohol use: Not Currently   Drug use: Never   Sexual activity: Not Currently  Other Topics Concern   Not on file  Social History Narrative   Not on file   Social Determinants of Health   Financial Resource Strain: Not on file  Food Insecurity: Not on file  Transportation Needs: Not on file  Physical Activity: Not on file  Stress: Not on file  Social Connections: Not on file  Intimate Partner Violence: Not on file     Allergies  Allergen Reactions   Lisinopril Other (See Comments)    Unsure of reaction    Penicillins Hives   Prednisone Other (See Comments)    Unsure of reaction    Aspirin Hives     Outpatient Medications Prior to Visit  Medication Sig Dispense Refill   atorvastatin (LIPITOR) 10 MG tablet Take 10 mg by mouth daily.     Blood Glucose Monitoring Suppl (CONTOUR NEXT ONE) DEVI 1 Device by Does not apply route daily. 1 each 0   glucose blood (ONETOUCH VERIO) test strip 1 each by Other route daily. And lancets 1/day 100 each 3   irbesartan (AVAPRO) 75 MG tablet Take 75 mg by mouth daily.     JARDIANCE 25 MG TABS tablet Take 1 tablet (25 mg total) by mouth daily. 90 tablet 3   metFORMIN (GLUCOPHAGE-XR) 500 MG 24 hr tablet Take 2 tablets (1,000 mg total) by mouth daily. 180 tablet 3   pioglitazone (ACTOS) 45 MG tablet Take 1 tablet (45 mg total) by mouth daily. 90 tablet 0   No facility-administered medications  prior to visit.    Review of Systems  Constitutional:  Negative for chills, fever, malaise/fatigue and weight loss.  HENT:  Negative for hearing loss, sore throat and tinnitus.   Eyes:  Negative for blurred vision and double vision.  Respiratory:  Negative for cough, hemoptysis, sputum production, shortness of breath, wheezing and stridor.   Cardiovascular:  Negative for chest pain, palpitations, orthopnea, leg swelling and PND.  Gastrointestinal:  Negative for abdominal pain, constipation, diarrhea, heartburn, nausea and vomiting.  Genitourinary:  Negative for dysuria, hematuria and urgency.  Musculoskeletal:  Negative for joint pain and myalgias.  Skin:  Negative for itching and rash.  Neurological:  Negative for dizziness, tingling, weakness and headaches.  Endo/Heme/Allergies:  Negative for environmental allergies. Does not bruise/bleed easily.  Psychiatric/Behavioral:  Negative for depression. The patient is not nervous/anxious and does not have insomnia.   All other systems reviewed and are negative.    Objective:  Physical Exam Vitals reviewed.  Constitutional:      General: He is not in acute distress.    Appearance: He is well-developed.  HENT:     Head: Normocephalic and atraumatic.  Eyes:     General: No scleral icterus.    Conjunctiva/sclera: Conjunctivae normal.     Pupils: Pupils are equal, round, and reactive to light.  Neck:     Vascular: No JVD.     Trachea: No tracheal deviation.  Cardiovascular:     Rate and Rhythm: Normal rate and regular rhythm.     Heart sounds: Normal heart sounds. No murmur heard. Pulmonary:     Effort: Pulmonary effort is normal. No tachypnea, accessory muscle usage or respiratory distress.     Breath sounds: No stridor. No wheezing, rhonchi or rales.     Comments: Diminished breath sounds bilaterally Abdominal:     General: There is no distension.     Palpations: Abdomen is soft.     Tenderness: There is no abdominal tenderness.   Musculoskeletal:        General: No tenderness.     Cervical back: Neck supple.  Lymphadenopathy:     Cervical: No cervical adenopathy.  Skin:    General: Skin is warm and dry.     Capillary Refill: Capillary refill takes less than 2 seconds.     Findings: No rash.  Neurological:     Mental Status: He is alert and oriented to person, place, and time.  Psychiatric:        Behavior: Behavior normal.      There were no vitals filed for this visit.      on RA BMI Readings from Last 3 Encounters:  10/18/21 30.18 kg/m  08/11/21 28.91 kg/m  06/12/21 28.68 kg/m   Wt Readings from Last 3 Encounters:  10/18/21 204 lb 6.4 oz (92.7 kg)  08/11/21 195 lb 12.8 oz (88.8 kg)  06/12/21 194 lb 3.2 oz (88.1 kg)     CBC    Component Value Date/Time   WBC 6.2 02/15/2021 1000   RBC 4.68 02/15/2021 1000   HGB 14.6 02/15/2021 1000   HCT 44.1 02/15/2021 1000   PLT 245 02/15/2021 1000   MCV 94.2 02/15/2021 1000   MCH 31.2 02/15/2021 1000   MCHC 33.1 02/15/2021 1000   RDW 12.6 02/15/2021 1000      Chest Imaging:  10/31/2020 LDCT: Longstanding history of smoking, more solid enlarging right lower lobe pulmonary nodule concerning for malignancy. The patient's images have been independently reviewed by me.    January 2023 CT chest: 9 mm pulmonary nodule right lower lobe stable in comparison to previous imaging. Peripheral groundglass opacities consistent with recent COVID-19 diagnosis. The patient's images have been independently reviewed by me.     Pulmonary Functions Testing Results:    Latest Ref Rng & Units 11/22/2020    3:50 PM  PFT Results  FVC-Pre L 3.23   FVC-Predicted Pre % 75   FVC-Post L 3.45   FVC-Predicted Post % 80   Pre FEV1/FVC % % 76   Post FEV1/FCV % % 75   FEV1-Pre L 2.44   FEV1-Predicted Pre % 78   FEV1-Post L 2.60   DLCO uncorrected ml/min/mmHg 19.39   DLCO UNC% % 76  DLCO corrected ml/min/mmHg 19.39   DLCO COR %Predicted % 76   DLVA Predicted  % 92   TLC L 5.43   TLC % Predicted % 77   RV % Predicted % 85     FeNO:   Pathology:   Echocardiogram:   Heart Catheterization:     Assessment & Plan:   No diagnosis found.   Discussion:  This is a 77 year old gentleman, longstanding history of tobacco use, 100+ pack year history, originally from [place], quit smoking in 2019.  PFTs with no significant obstructive defect relatively normal spirometry.  He has a right lower lobe pulmonary nodule that we have been following for some time.  It is still persistent.  It has been stable since his last 97-monthfollow-up CT.  Plan: We will have a repeat surveillance CT in 6 months. He would like to continue to follow the nodule for the time being. I did explain that if the nodule stays persistent or its solid component becomes larger that it does increase the risk that this could be a malignancy. Patient understands and we will continue to watch for the next 6 months. I did explain that if the nodule does get larger in size we could consider biopsy and referral for radiation if it was malignant as he has clearly stated he is not sure that he would want to have surgery.    Current Outpatient Medications:    atorvastatin (LIPITOR) 10 MG tablet, Take 10 mg by mouth daily., Disp: , Rfl:    Blood Glucose Monitoring Suppl (CONTOUR NEXT ONE) DEVI, 1 Device by Does not apply route daily., Disp: 1 each, Rfl: 0   glucose blood (ONETOUCH VERIO) test strip, 1 each by Other route daily. And lancets 1/day, Disp: 100 each, Rfl: 3   irbesartan (AVAPRO) 75 MG tablet, Take 75 mg by mouth daily., Disp: , Rfl:    JARDIANCE 25 MG TABS tablet, Take 1 tablet (25 mg total) by mouth daily., Disp: 90 tablet, Rfl: 3   metFORMIN (GLUCOPHAGE-XR) 500 MG 24 hr tablet, Take 2 tablets (1,000 mg total) by mouth daily., Disp: 180 tablet, Rfl: 3   pioglitazone (ACTOS) 45 MG tablet, Take 1 tablet (45 mg total) by mouth daily., Disp: 90 tablet, Rfl: 0   BGarner Nash  DO [facility] 04/15/2022 7:44 PM

## 2022-04-16 ENCOUNTER — Ambulatory Visit: Payer: Medicare Other | Admitting: Pulmonary Disease

## 2022-04-16 ENCOUNTER — Encounter: Payer: Self-pay | Admitting: Pulmonary Disease

## 2022-04-16 VITALS — BP 108/80 | HR 80 | Ht 69.0 in | Wt 206.0 lb

## 2022-04-16 DIAGNOSIS — Z87891 Personal history of nicotine dependence: Secondary | ICD-10-CM

## 2022-04-16 DIAGNOSIS — R911 Solitary pulmonary nodule: Secondary | ICD-10-CM | POA: Diagnosis not present

## 2022-04-16 NOTE — Patient Instructions (Signed)
Thank you for visiting Dr. Valeta Harms at [facility]. Today we recommend the following:  Orders Placed This Encounter  Procedures   CT Super D Chest Wo Contrast   Return in about 1 year (around 04/16/2023) for with Eric Form, NP, or Dr. Valeta Harms.    Please do your part to reduce the spread of COVID-19.

## 2022-04-18 DIAGNOSIS — I10 Essential (primary) hypertension: Secondary | ICD-10-CM | POA: Diagnosis not present

## 2022-04-18 DIAGNOSIS — E782 Mixed hyperlipidemia: Secondary | ICD-10-CM | POA: Diagnosis not present

## 2022-04-18 DIAGNOSIS — K760 Fatty (change of) liver, not elsewhere classified: Secondary | ICD-10-CM | POA: Diagnosis not present

## 2022-04-18 DIAGNOSIS — E1165 Type 2 diabetes mellitus with hyperglycemia: Secondary | ICD-10-CM | POA: Diagnosis not present

## 2022-07-25 DIAGNOSIS — I10 Essential (primary) hypertension: Secondary | ICD-10-CM | POA: Diagnosis not present

## 2022-08-20 DIAGNOSIS — I10 Essential (primary) hypertension: Secondary | ICD-10-CM | POA: Diagnosis not present

## 2022-08-20 DIAGNOSIS — E1165 Type 2 diabetes mellitus with hyperglycemia: Secondary | ICD-10-CM | POA: Diagnosis not present

## 2022-08-20 DIAGNOSIS — I2510 Atherosclerotic heart disease of native coronary artery without angina pectoris: Secondary | ICD-10-CM | POA: Diagnosis not present

## 2022-09-10 ENCOUNTER — Encounter: Payer: Self-pay | Admitting: Cardiovascular Disease

## 2022-09-10 NOTE — Progress Notes (Unsigned)
Cardiology Office Note:    Date:  09/11/2022   ID:  Tristan Sims, DOB 05-04-1945, MRN [id number]  PCP:  Darrow Bussing, MD  Cardiologist:  Kristeen Miss, MD  Electrophysiologist:  None   Referring MD: Darrow Bussing, MD   Chief Complaint  Patient presents with   coronary artery calcification      Previous notes:    Originally from [place]. Came to [place] in 1975.  Tristan Sims is a 77 y.o. male with a hx of hypertension, diabetes mellitus and hyperlipidemia.  He also has a history of an ascending aortic aneurysm.  He had a CT scan recently that revealed some coronary artery calcifications.  We are asked to see him today for further evaluation of his coronary artery calcifications.  Recent CT scan reveals an ascending aorta measuring 4.0 cm.     Labs from his primary medical doctor reveals a total cholesterol of 110.  The triglyceride level was 218.  His HDL is 29.  LDL is 37.  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  No CP or tightness   Former smoker ,  Quit 2 years ago .     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  Has some pain / numbness in his left shoulder  Not a chest pain ,  just a MSK pain .  Check lipids today   August 11, 2021 Tristan Sims is seen for follow up of his coronary artery calcifications, DM, HTN, HLD  Originally from [place]. Came to [place] in 1975.   Aug. 6, 2024 Tristan Sims is seen for follow up of his CAC, CM, HTN,HLD Works as a Music therapist      Past Medical History:  Diagnosis Date   Adenomatous polyps    Ascending aortic aneurysm (HCC)    CAD (coronary artery disease)    DM (diabetes mellitus), type 2 (HCC)    HTN (hypertension)    Hypercholesteremia    Tobacco dependence     Past Surgical History:  Procedure Laterality Date   EYE SURGERY Bilateral    cataract  removal   LACERATION REPAIR Left 2002   hand   VIDEO BRONCHOSCOPY WITH ENDOBRONCHIAL NAVIGATION N/A 02/16/2021   Procedure: VIDEO BRONCHOSCOPY WITH ENDOBRONCHIAL NAVIGATION;  Surgeon: Josephine Igo, DO;  Location: [facility];  Service: Pulmonary;  Laterality: N/A;  ION w/ CIOS    Current Medications: Current Meds  Medication Sig   atorvastatin (LIPITOR) 10 MG tablet Take 10 mg by mouth daily.   Blood Glucose Monitoring Suppl (CONTOUR NEXT ONE) DEVI 1 Device by Does not apply route daily.   glucose blood (ONETOUCH VERIO) test strip 1 each by Other route daily. And lancets 1/day   irbesartan (AVAPRO) 75 MG tablet Take 75 mg by mouth daily.   JARDIANCE 25 MG TABS tablet Take 1 tablet (25 mg total) by mouth daily.   metFORMIN (GLUCOPHAGE-XR) 500 MG 24 hr tablet Take 2 tablets (1,000 mg total) by mouth daily.   omeprazole (PRILOSEC) 40 MG capsule Take 40 mg by mouth every morning.   pioglitazone (ACTOS) 45 MG tablet Take 1 tablet (45 mg total) by mouth daily.     Allergies:   Lisinopril, Penicillins, Prednisone, and Aspirin   Social History   Socioeconomic History  Marital status: Married    Spouse name: Rinaldo Cloud   Number of children: 4   Years of education: Not on file   Highest education level: Not on file  Occupational History   Not on file  Tobacco Use   Smoking status: Former    Current packs/day: 0.00    Average packs/day: 2.0 packs/day for 53.0 years (106.0 ttl pk-yrs)    Types: Cigarettes    Start date: 89    Quit date: 2019    Years since quitting: 5.6   Smokeless tobacco: Never   Tobacco comments:    Counseled about need to quit smoking  Vaping Use   Vaping status: Never Used  Substance and Sexual Activity   Alcohol use: Not Currently   Drug use: Never   Sexual activity: Not Currently  Other Topics Concern   Not on file  Social History Narrative   Not on file   Social Determinants of Health   Financial Resource Strain: Not on file  Food Insecurity:  Not on file  Transportation Needs: Not on file  Physical Activity: Not on file  Stress: Not on file  Social Connections: Not on file     Family History: The patient's family history includes Healthy in his father and mother. There is no history of Diabetes.  Mother and father lived in [place].   Never went to the doctor   ROS:   Please see the history of present illness.     All other systems reviewed and are negative.  EKGs/Labs/Other Studies Reviewed:    The following studies were reviewed today:  EKG Interpretation Date/Time:  Tuesday September 11 2022 08:15:57 EDT Ventricular Rate:  67 PR Interval:  176 QRS Duration:  98 QT Interval:  416 QTC Calculation: 439 R Axis:   4  Text Interpretation: Normal sinus rhythm Minimal voltage criteria for LVH, may be normal variant ( R in aVL ) When compared with ECG of 15-Feb-2021 10:05, Inverted T waves have replaced nonspecific T wave abnormality in Inferior leads the NS TWI in the inferior leads are new compared to previous ECG from 02/15/21 Confirmed by Kristeen Miss ([id number]) on 09/11/2022 8:21:33 AM    Recent Labs: No results found for requested labs within last 365 days.  Recent Lipid Panel    Component Value Date/Time   CHOL 158 08/05/2020 0823   TRIG 348 (H) 08/05/2020 0823   HDL 29 (L) 08/05/2020 0823   CHOLHDL 5.4 (H) 08/05/2020 0823   LDLCALC 73 08/05/2020 0823    Physical Exam:     Physical Exam: Blood pressure 114/80, pulse 73, height 5\' 9"  (1.753 m), weight 197 lb 9.6 oz (89.6 kg), SpO2 97%.       GEN:  Well nourished, well developed in no acute distress HEENT: Normal NECK: No JVD; No carotid bruits LYMPHATICS: No lymphadenopathy CARDIAC: RRR , no murmurs, rubs, gallops RESPIRATORY:  Clear to auscultation without rales, wheezing or rhonchi  ABDOMEN: Soft, non-tender, non-distended MUSCULOSKELETAL:  No edema; No deformity  SKIN: Warm and dry NEUROLOGIC:  Alert and oriented x 3    EKG:    EKG  Interpretation Date/Time:  Tuesday September 11 2022 08:15:57 EDT Ventricular Rate:  67 PR Interval:  176 QRS Duration:  98 QT Interval:  416 QTC Calculation: 439 R Axis:   4  Text Interpretation: Normal sinus rhythm Minimal voltage criteria for LVH, may be normal variant ( R in aVL ) When compared with ECG of 15-Feb-2021 10:05, Inverted T waves  have replaced nonspecific T wave abnormality in Inferior leads the NS TWI in the inferior leads are new compared to previous ECG from 02/15/21 Confirmed by Kristeen Miss ([id number]) on 09/11/2022 8:21:33 AM     ASSESSMENT:    1. Preop cardiovascular exam   2. Primary hypertension       PLAN:      Coronary artery calcifications:  no angina .   He works as a Music therapist .  No CP with exertion   .  2.  Hyperlipidemia:       He is on atorvastatin 10 mg a day.  His last LDL is 55.  Cont current medications.  Will check his labs again in 1 year when I see him for an office visit.  2.  Hypertension: Blood pressure is well-controlled.  Continue current medications.    Medication Adjustments/Labs and Tests Ordered: Current medicines are reviewed at length with the patient today.  Concerns regarding medicines are outlined above.  Orders Placed This Encounter  Procedures   EKG 12-Lead     No orders of the defined types were placed in this encounter.    There are no Patient Instructions on file for this visit.   Signed, Kristeen Miss, MD  09/11/2022 8:23 AM    [facility]

## 2022-09-11 ENCOUNTER — Ambulatory Visit: Payer: Medicare Other | Attending: Cardiovascular Disease | Admitting: Cardiovascular Disease

## 2022-09-11 ENCOUNTER — Encounter: Payer: Self-pay | Admitting: Cardiovascular Disease

## 2022-09-11 VITALS — BP 114/80 | HR 73 | Ht 69.0 in | Wt 197.6 lb

## 2022-09-11 DIAGNOSIS — I10 Essential (primary) hypertension: Secondary | ICD-10-CM

## 2022-09-11 DIAGNOSIS — E782 Mixed hyperlipidemia: Secondary | ICD-10-CM | POA: Diagnosis not present

## 2022-09-11 NOTE — Patient Instructions (Signed)
Medication Instructions:  Your physician recommends that you continue on your current medications as directed. Please refer to the Current Medication list given to you today.  *If you need a refill on your cardiac medications before your next appointment, please call your pharmacy*   Lab Work: Lipids at next year's visit If you have labs (blood work) drawn today and your tests are completely normal, you will receive your results only by: MyChart Message (if you have MyChart) OR A paper copy in the mail If you have any lab test that is abnormal or we need to change your treatment, we will call you to review the results.   Testing/Procedures: NONE   Follow-Up: At [facility], you and your health needs are our priority.  As part of our continuing mission to provide you with exceptional heart care, we have created designated Provider Care Teams.  These Care Teams include your primary Cardiologist (physician) and Advanced Practice Providers (APPs -  Physician Assistants and Nurse Practitioners) who all work together to provide you with the care you need, when you need it.  We recommend signing up for the patient portal called "MyChart".  Sign up information is provided on this After Visit Summary.  MyChart is used to connect with patients for Virtual Visits (Telemedicine).  Patients are able to view lab/test results, encounter notes, upcoming appointments, etc.  Non-urgent messages can be sent to your provider as well.   To learn more about what you can do with MyChart, go to [email or web address].    Your next appointment:   1 year(s)  Provider:   Kristeen Miss, MD

## 2022-10-12 DIAGNOSIS — I10 Essential (primary) hypertension: Secondary | ICD-10-CM | POA: Diagnosis not present

## 2022-10-12 DIAGNOSIS — E1165 Type 2 diabetes mellitus with hyperglycemia: Secondary | ICD-10-CM | POA: Diagnosis not present

## 2022-10-12 DIAGNOSIS — Z23 Encounter for immunization: Secondary | ICD-10-CM | POA: Diagnosis not present

## 2022-10-12 DIAGNOSIS — E7800 Pure hypercholesterolemia, unspecified: Secondary | ICD-10-CM | POA: Diagnosis not present

## 2022-10-12 DIAGNOSIS — I77810 Thoracic aortic ectasia: Secondary | ICD-10-CM | POA: Diagnosis not present

## 2022-12-18 DIAGNOSIS — I10 Essential (primary) hypertension: Secondary | ICD-10-CM | POA: Diagnosis not present

## 2022-12-18 DIAGNOSIS — I2510 Atherosclerotic heart disease of native coronary artery without angina pectoris: Secondary | ICD-10-CM | POA: Diagnosis not present

## 2022-12-18 DIAGNOSIS — E1165 Type 2 diabetes mellitus with hyperglycemia: Secondary | ICD-10-CM | POA: Diagnosis not present

## 2022-12-18 DIAGNOSIS — E782 Mixed hyperlipidemia: Secondary | ICD-10-CM | POA: Diagnosis not present

## 2023-04-11 DIAGNOSIS — E7800 Pure hypercholesterolemia, unspecified: Secondary | ICD-10-CM | POA: Diagnosis not present

## 2023-04-11 DIAGNOSIS — R911 Solitary pulmonary nodule: Secondary | ICD-10-CM | POA: Diagnosis not present

## 2023-04-11 DIAGNOSIS — E1165 Type 2 diabetes mellitus with hyperglycemia: Secondary | ICD-10-CM | POA: Diagnosis not present

## 2023-04-11 DIAGNOSIS — J432 Centrilobular emphysema: Secondary | ICD-10-CM | POA: Diagnosis not present

## 2023-04-18 DIAGNOSIS — E1165 Type 2 diabetes mellitus with hyperglycemia: Secondary | ICD-10-CM | POA: Diagnosis not present

## 2023-04-18 DIAGNOSIS — K760 Fatty (change of) liver, not elsewhere classified: Secondary | ICD-10-CM | POA: Diagnosis not present

## 2023-04-18 DIAGNOSIS — E782 Mixed hyperlipidemia: Secondary | ICD-10-CM | POA: Diagnosis not present

## 2023-04-18 DIAGNOSIS — I700 Atherosclerosis of aorta: Secondary | ICD-10-CM | POA: Diagnosis not present

## 2023-04-29 ENCOUNTER — Ambulatory Visit (HOSPITAL_COMMUNITY)
Admission: RE | Admit: 2023-04-29 | Discharge: 2023-04-29 | Disposition: A | Discharge status: Home / Self Care | Source: Ambulatory Visit | Attending: Acute Care | Admitting: Acute Care

## 2023-04-29 ENCOUNTER — Other Ambulatory Visit: Payer: Self-pay

## 2023-04-29 ENCOUNTER — Telehealth: Payer: Self-pay

## 2023-04-29 DIAGNOSIS — J432 Centrilobular emphysema: Secondary | ICD-10-CM | POA: Diagnosis not present

## 2023-04-29 DIAGNOSIS — R911 Solitary pulmonary nodule: Secondary | ICD-10-CM | POA: Diagnosis not present

## 2023-04-29 DIAGNOSIS — I7121 Aneurysm of the ascending aorta, without rupture: Secondary | ICD-10-CM | POA: Diagnosis not present

## 2023-04-29 DIAGNOSIS — R918 Other nonspecific abnormal finding of lung field: Secondary | ICD-10-CM | POA: Diagnosis not present

## 2023-04-29 NOTE — Telephone Encounter (Signed)
 Called and talked to pt wife to inform her that we are going to cancel that appointment until that CT scan was done order was put in as stat and told them to give [place] a call back when it is done to schedule that follow up pts wife verbalized understanding.NFN

## 2023-04-30 ENCOUNTER — Ambulatory Visit: Payer: Medicare Other | Admitting: Acute Care

## 2023-05-01 ENCOUNTER — Encounter: Payer: Self-pay | Admitting: Acute Care

## 2023-05-01 ENCOUNTER — Encounter: Payer: Self-pay | Admitting: Emergency Medicine

## 2023-05-01 ENCOUNTER — Ambulatory Visit (INDEPENDENT_AMBULATORY_CARE_PROVIDER_SITE_OTHER): Admitting: Acute Care

## 2023-05-01 VITALS — BP 134/82 | HR 71 | Ht 69.0 in | Wt 200.6 lb

## 2023-05-01 DIAGNOSIS — R911 Solitary pulmonary nodule: Secondary | ICD-10-CM | POA: Diagnosis not present

## 2023-05-01 DIAGNOSIS — Z87891 Personal history of nicotine dependence: Secondary | ICD-10-CM | POA: Diagnosis not present

## 2023-05-01 NOTE — H&P (View-Only) (Signed)
 History of Present Illness Tristan Sims is a 78 y.o. male former smoker ( Quit 2019 with a referred 11/2020 for lung nodule by Dr. Docia Chuck. with a 106 pack year smoking history. Initially followed by Dr. Tonia Brooms. Will now be followed by Dr. Delton Coombes.  Synopsis  78 year old gentleman, past medical history of diabetes, hypertension, hypercholesterolemia and tobacco use.  He quit smoking in 2019.  Had a 50+ pack year history of smoking.  He is originally from [place].  He is still working in Insurance claims handler.  He had a lung cancer screening CT that showed a slowly enlarging right lower lobe pulmonary nodule.  This went from a subsolid state to a more solid state concerning for a primary bronchogenic carcinoma.  Patient has had a PET scan complete with no other distant metastasis.  As well as pulmonary function test which revealed normal spirometry, no significant obstruction Ratio of 75, FEV1 of 2.6 L, TLC 77%, DLCO 76% predicted.  Patient from a respiratory standpoint has no significant complaints and is able to complete all of his activities of daily living.  He does have a planned trip coming up at the beginning of December to visit family in [place] for approximately 1 month.   He was scheduled for robotic bronchoscopy with biopsies 02/2021 , and this was complicated with a Covid diagnosis on the table, and hypoxia, so the procedure had to be aborted. Dr. Tonia Brooms has been following the nodule for stability with   CT Scans . He was seen last  in 04/2022 by Dr. Tonia Brooms. At that time imaging showed the nodule was a bit smaller. Plan was for a 12 month follow up scan.    05/01/2023 Pt. Presents for follow up CT chest. CT Chest done 04/29/2023 shows the right lower lobe pulmonary nodule has enlarged in the last year from 7 mm  in March 2024 to 1.1 by 1.2 by 0.6 cm , solid portion of which measures 0.5 by 0.4 by 0.4 cm Patient and his wife are in agreement with biopsy to better evaluate this for malignancy as  it appears to be growing. We have discussed the risks and benefits of bronchoscopy with biopsy to include bleeding, infection, pneumothorax and adverse reaction to anesthesia. Both he and his wife are in agreement with bronchoscopy with biopsy. Order has been placed, and patient is scheduled for  05/13/2023. Patient is not on any blood thinners.   Test Results: CT Chest 04/29/2023 Part solid right lower lobe nodule measuring 1.1 by 1.2 by 0.6 cm on image 95 series 5, solid portion measures 0.5 by 0.4 by 0.4 cm. 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   Chronic scarring inferiorly in the right middle lobe. Mild scarring in the posterior basal segments of the lower lobes. Mild airway plugging in the posterior basal segment right lower lobe. Small calcified nodule centrally in the left lower lobe compatible with calcified granuloma.   Peribronchovascular nodule in the left upper lobe measures by 3 mm on image 77 series 5 and is stable from 2020, and considered benign.   Upper Abdomen: No change or acute findings.   Musculoskeletal: Thoracic spondylosis.   IMPRESSION: 1. Part solid right lower lobe nodule measuring 1.1 by 1.2 by 0.6 cm, solid portion measures 0.5 by 0.4 by 0.4 cm. This nodule has enlarged compared to 04/11/2022 and has enlarged compared to 11/20/2018, and accordingly could well represent an  indolent low-grade adenocarcinoma. This was negative on the 2022 PET-CT when it was smaller. Consider tissue diagnosis or resection. 2. Coronary, aortic arch, and branch vessel atherosclerotic vascular disease. 3. Ascending aortic aneurysm 4.1 cm in diameter. Recommend annual imaging followup by CTA or MRA. This recommendation follows 2010 ACCF/AHA/AATS/ACR/ASA/SCA/SCAI/SIR/STS/SVM Guidelines for the Diagnosis and Management of Patients with Thoracic Aortic  Disease. Circulation. 2010; 121: Y403-K742. Aortic aneurysm NOS (ICD10-I71.9) 4. Aortic Atherosclerosis (ICD10-I70.0) and Emphysema (ICD10-J43.9).  11/22/2020 PET scan The slow growing right lower lobe pulmonary nodule demonstrates no hypermetabolic activity. While reassuring, this does not exclude low-grade malignancy such as adenocarcinoma. 2. Focal hypermetabolic activity at the left prostatic apex. Recommend correlation with serum PSA levels and consideration of urology consultation. 3. Incidental findings as above, including hepatic steatosis, probable hepatic and left renal cysts, coronary andAortic Atherosclerosis (ICD10-I70.0) and Emphysema (ICD10-J43.9).     Latest Ref Rng & Units 02/15/2021   10:00 AM  CBC  WBC 4.0 - 10.5 K/uL 6.2   Hemoglobin 13.0 - 17.0 g/dL 59.5   Hematocrit 63.8 - 52.0 % 44.1   Platelets 150 - 400 K/uL 245        Latest Ref Rng & Units 02/15/2021   10:00 AM 08/09/2020    9:08 AM 08/05/2020    8:23 AM  BMP  Glucose 70 - 99 mg/dL 756  433  295   BUN 8 - 23 mg/dL 15  34  49   Creatinine 0.61 - 1.24 mg/dL 1.88  4.16  6.06   BUN/Creat Ratio 10 - 24  28  32   Sodium 135 - 145 mmol/L 138  141  137   Potassium 3.5 - 5.1 mmol/L 3.9  4.9  5.8   Chloride 98 - 111 mmol/L 108  106  106   CO2 22 - 32 mmol/L 20  21  15    Calcium 8.9 - 10.3 mg/dL 9.2  9.1  30.1     BNP No results found for: "BNP"  ProBNP No results found for: "PROBNP"  PFT    Component Value Date/Time   FEV1PRE 2.44 11/22/2020 1550   FEV1POST 2.60 11/22/2020 1550   FVCPRE 3.23 11/22/2020 1550   FVCPOST 3.45 11/22/2020 1550   TLC 5.43 11/22/2020 1550   DLCOUNC 19.39 11/22/2020 1550   PREFEV1FVCRT 76 11/22/2020 1550   PSTFEV1FVCRT 75 11/22/2020 1550    CT Chest Wo Contrast Result Date: 04/29/2023 CLINICAL DATA:  Lung nodule surveillance EXAM: CT CHEST WITHOUT CONTRAST TECHNIQUE: Multidetector CT imaging of the chest was performed following the standard protocol without IV contrast.  RADIATION DOSE REDUCTION: This exam was performed according to the departmental dose-optimization program which includes automated exposure control, adjustment of the mA and/or kV according to patient size and/or use of iterative reconstruction technique. COMPARISON:  04/11/2022 FINDINGS: Cardiovascular: Coronary, aortic arch, and branch vessel atherosclerotic vascular disease. Ascending aortic aneurysm 4.1 cm in diameter. Mediastinum/Nodes: Unremarkable Lungs/Pleura: Centrilobular emphysema. Part solid right lower lobe nodule measuring 1.1 by 1.2 by 0.6 cm on image 95 series 5, solid portion measures 0.5 by 0.4 by 0.4 cm. 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 Chronic scarring inferiorly in the right middle lobe. Mild scarring in the posterior basal segments of the lower lobes. Mild airway plugging in the posterior basal segment right lower lobe. Small calcified nodule centrally in the left lower lobe compatible with calcified granuloma. Peribronchovascular nodule in the left upper lobe measures  by 3 mm on image 77 series 5 and is stable from 2020, and considered benign. Upper Abdomen: No change or acute findings. Musculoskeletal: Thoracic spondylosis. IMPRESSION: 1. Part solid right lower lobe nodule measuring 1.1 by 1.2 by 0.6 cm, solid portion measures 0.5 by 0.4 by 0.4 cm. 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 This was negative on the 2022 PET-CT when it was smaller. Consider tissue diagnosis or resection. 2. Coronary, aortic arch, and branch vessel atherosclerotic vascular disease. 3. Ascending aortic aneurysm 4.1 cm in diameter. Recommend annual imaging followup by CTA or MRA. This recommendation follows 2010 ACCF/AHA/AATS/ACR/ASA/SCA/SCAI/SIR/STS/SVM Guidelines for the  Diagnosis and Management of Patients with Thoracic Aortic Disease. Circulation. 2010; 121: O962-X528. Aortic aneurysm NOS (ICD10-I71.9) 4. Aortic Atherosclerosis (ICD10-I70.0) and Emphysema (ICD10-J43.9). Electronically Signed   By: Gaylyn Rong M.D.   On: 04/29/2023 19:53     Past medical hx Past Medical History:  Diagnosis Date   Adenomatous polyps    Ascending aortic aneurysm (HCC)    CAD (coronary artery disease)    DM (diabetes mellitus), type 2 (HCC)    HTN (hypertension)    Hypercholesteremia    Tobacco dependence      Social History   Tobacco Use   Smoking status: Former    Current packs/day: 0.00    Average packs/day: 2.0 packs/day for 53.0 years (106.0 ttl pk-yrs)    Types: Cigarettes    Start date: 45    Quit date: 2019    Years since quitting: 6.2    Passive exposure: Never   Smokeless tobacco: Never   Tobacco comments:    Quit smoking 2019  Vaping Use   Vaping status: Never Used  Substance Use Topics   Alcohol use: Not Currently   Drug use: Never    Mr.Todaro reports that he quit smoking about 6 years ago. His smoking use included cigarettes. He started smoking about 59 years ago. He has a 106 pack-year smoking history. He has never been exposed to tobacco smoke. He has never used smokeless tobacco. He reports that he does not currently use alcohol. He reports that he does not use drugs.  Tobacco Cessation: Counseling given: Not Answered Tobacco comments: Quit smoking 2019 Former smoker with a 106 pack year smoking history  Past surgical hx, Family hx, Social hx all reviewed.  Current Outpatient Medications on File Prior to Visit  Medication Sig   atorvastatin (LIPITOR) 10 MG tablet Take 10 mg by mouth daily.   Blood Glucose Monitoring Suppl (CONTOUR NEXT ONE) DEVI 1 Device by Does not apply route daily.   glucose blood (ONETOUCH VERIO) test strip 1 each by Other route daily. And lancets 1/day   irbesartan (AVAPRO) 75 MG tablet Take 75 mg by mouth  daily.   JARDIANCE 25 MG TABS tablet Take 1 tablet (25 mg total) by mouth daily.   metFORMIN (GLUCOPHAGE-XR) 500 MG 24 hr tablet Take 2 tablets (1,000 mg total) by mouth daily.   omeprazole (PRILOSEC) 40 MG capsule Take 40 mg by mouth every morning.   pioglitazone (ACTOS) 45 MG tablet Take 1 tablet (45 mg total) by mouth daily.   No current facility-administered medications on file prior to visit.     Allergies  Allergen Reactions   Lisinopril Other (See Comments)    Unsure of reaction    Penicillins Hives   Prednisone Other (See Comments)    Unsure of reaction  Aspirin Hives    Review Of Systems:  Constitutional:   No  weight loss, night sweats,  Fevers, chills, fatigue, or  lassitude.  HEENT:   No headaches,  Difficulty swallowing,  Tooth/dental problems, or  Sore throat,                No sneezing, itching, ear ache, nasal congestion, post nasal drip,   CV:  No chest pain,  Orthopnea, PND, swelling in lower extremities, anasarca, dizziness, palpitations, syncope.   GI  No heartburn, indigestion, abdominal pain, nausea, vomiting, diarrhea, change in bowel habits, loss of appetite, bloody stools.   Resp: No shortness of breath with exertion or at rest.  No excess mucus, no productive cough,  No non-productive cough,  No coughing up of blood.  No change in color of mucus.  No wheezing.  No chest wall deformity  Skin: no rash or lesions.  GU: no dysuria, change in color of urine, no urgency or frequency.  No flank pain, no hematuria   MS:  No joint pain or swelling.  No decreased range of motion.  No back pain.  Psych:  No change in mood or affect. No depression or anxiety.  No memory loss.   Vital Signs BP 134/82 (BP Location: Left Arm, Patient Position: Sitting, Cuff Size: Normal)   Pulse 71   Ht 5\' 9"  (1.753 m)   Wt 200 lb 9.6 oz (91 kg)   SpO2 96%   BMI 29.62 kg/m    Physical Exam:  General- No distress,  A&Ox3, pleasant ENT: No sinus tenderness, TM clear,  pale nasal mucosa, no oral exudate,no post nasal drip, no LAN Cardiac: S1, S2, regular rate and rhythm, no murmur Chest: No wheeze/ rales/ dullness; no accessory muscle use, no nasal flaring, no sternal retractions  Abd.: Soft Non-tender, ND, BS +. Body mass index is 29.62 kg/m. Ext: No clubbing cyanosis, edema Neuro:  normal strength, MAE x 4, A&O x 3, appropriate Skin: No rashes, warm and dry,  no obvious lesions  Psych: normal mood and behavior   Assessment/Plan Growing right lower lobe pulmonary nodule with solid component increasing in size Former smoker Plan The repeat CT scan done 04/29/2023 does show that there is been some progression of the right lower lobe pulmonary nodule we have been monitoring. We will plan for bronchoscopy with biopsies to better evaluate this nodule. Risks include bleeding, infection, puncture of the lung, and adverse reaction to anesthesia. These are very rare but we did discuss them with you today, and you have agreed to move forward. You have given your consent to moving forward with bronchoscopy with biopsy. You will need someone to drive you to the hospital the morning of the procedure, stay during the procedure, and drive you home after the procedure. Additionally they will need to stay with you for 24 hours after anesthesia to ensure that you have cleared the medication completely. You will follow-up with me 1 week after the procedure to review the results of the biopsy. This will be scheduled today on the letter that you get which gives additional information about the procedure. Please call if you have any questions. I will see you at follow-up after the procedure with the results of your biopsy. Please contact office for sooner follow up if symptoms do not improve or worsen or seek emergency care     I spent 35 minutes dedicated to the care of this patient on the date of this encounter to include pre-visit  review of records, face-to-face time with  the patient discussing conditions above, post visit ordering of testing, clinical documentation with the electronic health record, making appropriate referrals as documented, and communicating necessary information to the patient's healthcare team.    Bevelyn Ngo, NP 05/01/2023  5:12 PM

## 2023-05-01 NOTE — Progress Notes (Signed)
 History of Present Illness Tristan Sims is a 78 y.o. male former smoker ( Quit 2019 with a referred 11/2020 for lung nodule by Dr. Docia Chuck. with a 106 pack year smoking history. Initially followed by Dr. Tonia Brooms. Will now be followed by Dr. Delton Coombes.  Synopsis  78 year old gentleman, past medical history of diabetes, hypertension, hypercholesterolemia and tobacco use.  He quit smoking in 2019.  Had a 50+ pack year history of smoking.  He is originally from [place].  He is still working in Insurance claims handler.  He had a lung cancer screening CT that showed a slowly enlarging right lower lobe pulmonary nodule.  This went from a subsolid state to a more solid state concerning for a primary bronchogenic carcinoma.  Patient has had a PET scan complete with no other distant metastasis.  As well as pulmonary function test which revealed normal spirometry, no significant obstruction Ratio of 75, FEV1 of 2.6 L, TLC 77%, DLCO 76% predicted.  Patient from a respiratory standpoint has no significant complaints and is able to complete all of his activities of daily living.  He does have a planned trip coming up at the beginning of December to visit family in [place] for approximately 1 month.   He was scheduled for robotic bronchoscopy with biopsies 02/2021 , and this was complicated with a Covid diagnosis on the table, and hypoxia, so the procedure had to be aborted. Dr. Tonia Brooms has been following the nodule for stability with   CT Scans . He was seen last  in 04/2022 by Dr. Tonia Brooms. At that time imaging showed the nodule was a bit smaller. Plan was for a 12 month follow up scan.    05/01/2023 Pt. Presents for follow up CT chest. CT Chest done 04/29/2023 shows the right lower lobe pulmonary nodule has enlarged in the last year from 7 mm  in March 2024 to 1.1 by 1.2 by 0.6 cm , solid portion of which measures 0.5 by 0.4 by 0.4 cm Patient and his wife are in agreement with biopsy to better evaluate this for malignancy as  it appears to be growing. We have discussed the risks and benefits of bronchoscopy with biopsy to include bleeding, infection, pneumothorax and adverse reaction to anesthesia. Both he and his wife are in agreement with bronchoscopy with biopsy. Order has been placed, and patient is scheduled for  05/13/2023. Patient is not on any blood thinners.   Test Results: CT Chest 04/29/2023 Part solid right lower lobe nodule measuring 1.1 by 1.2 by 0.6 cm on image 95 series 5, solid portion measures 0.5 by 0.4 by 0.4 cm. 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   Chronic scarring inferiorly in the right middle lobe. Mild scarring in the posterior basal segments of the lower lobes. Mild airway plugging in the posterior basal segment right lower lobe. Small calcified nodule centrally in the left lower lobe compatible with calcified granuloma.   Peribronchovascular nodule in the left upper lobe measures by 3 mm on image 77 series 5 and is stable from 2020, and considered benign.   Upper Abdomen: No change or acute findings.   Musculoskeletal: Thoracic spondylosis.   IMPRESSION: 1. Part solid right lower lobe nodule measuring 1.1 by 1.2 by 0.6 cm, solid portion measures 0.5 by 0.4 by 0.4 cm. This nodule has enlarged compared to 04/11/2022 and has enlarged compared to 11/20/2018, and accordingly could well represent an  indolent low-grade adenocarcinoma. This was negative on the 2022 PET-CT when it was smaller. Consider tissue diagnosis or resection. 2. Coronary, aortic arch, and branch vessel atherosclerotic vascular disease. 3. Ascending aortic aneurysm 4.1 cm in diameter. Recommend annual imaging followup by CTA or MRA. This recommendation follows 2010 ACCF/AHA/AATS/ACR/ASA/SCA/SCAI/SIR/STS/SVM Guidelines for the Diagnosis and Management of Patients with Thoracic Aortic  Disease. Circulation. 2010; 121: Y403-K742. Aortic aneurysm NOS (ICD10-I71.9) 4. Aortic Atherosclerosis (ICD10-I70.0) and Emphysema (ICD10-J43.9).  11/22/2020 PET scan The slow growing right lower lobe pulmonary nodule demonstrates no hypermetabolic activity. While reassuring, this does not exclude low-grade malignancy such as adenocarcinoma. 2. Focal hypermetabolic activity at the left prostatic apex. Recommend correlation with serum PSA levels and consideration of urology consultation. 3. Incidental findings as above, including hepatic steatosis, probable hepatic and left renal cysts, coronary andAortic Atherosclerosis (ICD10-I70.0) and Emphysema (ICD10-J43.9).     Latest Ref Rng & Units 02/15/2021   10:00 AM  CBC  WBC 4.0 - 10.5 K/uL 6.2   Hemoglobin 13.0 - 17.0 g/dL 59.5   Hematocrit 63.8 - 52.0 % 44.1   Platelets 150 - 400 K/uL 245        Latest Ref Rng & Units 02/15/2021   10:00 AM 08/09/2020    9:08 AM 08/05/2020    8:23 AM  BMP  Glucose 70 - 99 mg/dL 756  433  295   BUN 8 - 23 mg/dL 15  34  49   Creatinine 0.61 - 1.24 mg/dL 1.88  4.16  6.06   BUN/Creat Ratio 10 - 24  28  32   Sodium 135 - 145 mmol/L 138  141  137   Potassium 3.5 - 5.1 mmol/L 3.9  4.9  5.8   Chloride 98 - 111 mmol/L 108  106  106   CO2 22 - 32 mmol/L 20  21  15    Calcium 8.9 - 10.3 mg/dL 9.2  9.1  30.1     BNP No results found for: "BNP"  ProBNP No results found for: "PROBNP"  PFT    Component Value Date/Time   FEV1PRE 2.44 11/22/2020 1550   FEV1POST 2.60 11/22/2020 1550   FVCPRE 3.23 11/22/2020 1550   FVCPOST 3.45 11/22/2020 1550   TLC 5.43 11/22/2020 1550   DLCOUNC 19.39 11/22/2020 1550   PREFEV1FVCRT 76 11/22/2020 1550   PSTFEV1FVCRT 75 11/22/2020 1550    CT Chest Wo Contrast Result Date: 04/29/2023 CLINICAL DATA:  Lung nodule surveillance EXAM: CT CHEST WITHOUT CONTRAST TECHNIQUE: Multidetector CT imaging of the chest was performed following the standard protocol without IV contrast.  RADIATION DOSE REDUCTION: This exam was performed according to the departmental dose-optimization program which includes automated exposure control, adjustment of the mA and/or kV according to patient size and/or use of iterative reconstruction technique. COMPARISON:  04/11/2022 FINDINGS: Cardiovascular: Coronary, aortic arch, and branch vessel atherosclerotic vascular disease. Ascending aortic aneurysm 4.1 cm in diameter. Mediastinum/Nodes: Unremarkable Lungs/Pleura: Centrilobular emphysema. Part solid right lower lobe nodule measuring 1.1 by 1.2 by 0.6 cm on image 95 series 5, solid portion measures 0.5 by 0.4 by 0.4 cm. 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 Chronic scarring inferiorly in the right middle lobe. Mild scarring in the posterior basal segments of the lower lobes. Mild airway plugging in the posterior basal segment right lower lobe. Small calcified nodule centrally in the left lower lobe compatible with calcified granuloma. Peribronchovascular nodule in the left upper lobe measures  by 3 mm on image 77 series 5 and is stable from 2020, and considered benign. Upper Abdomen: No change or acute findings. Musculoskeletal: Thoracic spondylosis. IMPRESSION: 1. Part solid right lower lobe nodule measuring 1.1 by 1.2 by 0.6 cm, solid portion measures 0.5 by 0.4 by 0.4 cm. 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 This was negative on the 2022 PET-CT when it was smaller. Consider tissue diagnosis or resection. 2. Coronary, aortic arch, and branch vessel atherosclerotic vascular disease. 3. Ascending aortic aneurysm 4.1 cm in diameter. Recommend annual imaging followup by CTA or MRA. This recommendation follows 2010 ACCF/AHA/AATS/ACR/ASA/SCA/SCAI/SIR/STS/SVM Guidelines for the  Diagnosis and Management of Patients with Thoracic Aortic Disease. Circulation. 2010; 121: O962-X528. Aortic aneurysm NOS (ICD10-I71.9) 4. Aortic Atherosclerosis (ICD10-I70.0) and Emphysema (ICD10-J43.9). Electronically Signed   By: Gaylyn Rong M.D.   On: 04/29/2023 19:53     Past medical hx Past Medical History:  Diagnosis Date   Adenomatous polyps    Ascending aortic aneurysm (HCC)    CAD (coronary artery disease)    DM (diabetes mellitus), type 2 (HCC)    HTN (hypertension)    Hypercholesteremia    Tobacco dependence      Social History   Tobacco Use   Smoking status: Former    Current packs/day: 0.00    Average packs/day: 2.0 packs/day for 53.0 years (106.0 ttl pk-yrs)    Types: Cigarettes    Start date: 45    Quit date: 2019    Years since quitting: 6.2    Passive exposure: Never   Smokeless tobacco: Never   Tobacco comments:    Quit smoking 2019  Vaping Use   Vaping status: Never Used  Substance Use Topics   Alcohol use: Not Currently   Drug use: Never    Mr.Todaro reports that he quit smoking about 6 years ago. His smoking use included cigarettes. He started smoking about 59 years ago. He has a 106 pack-year smoking history. He has never been exposed to tobacco smoke. He has never used smokeless tobacco. He reports that he does not currently use alcohol. He reports that he does not use drugs.  Tobacco Cessation: Counseling given: Not Answered Tobacco comments: Quit smoking 2019 Former smoker with a 106 pack year smoking history  Past surgical hx, Family hx, Social hx all reviewed.  Current Outpatient Medications on File Prior to Visit  Medication Sig   atorvastatin (LIPITOR) 10 MG tablet Take 10 mg by mouth daily.   Blood Glucose Monitoring Suppl (CONTOUR NEXT ONE) DEVI 1 Device by Does not apply route daily.   glucose blood (ONETOUCH VERIO) test strip 1 each by Other route daily. And lancets 1/day   irbesartan (AVAPRO) 75 MG tablet Take 75 mg by mouth  daily.   JARDIANCE 25 MG TABS tablet Take 1 tablet (25 mg total) by mouth daily.   metFORMIN (GLUCOPHAGE-XR) 500 MG 24 hr tablet Take 2 tablets (1,000 mg total) by mouth daily.   omeprazole (PRILOSEC) 40 MG capsule Take 40 mg by mouth every morning.   pioglitazone (ACTOS) 45 MG tablet Take 1 tablet (45 mg total) by mouth daily.   No current facility-administered medications on file prior to visit.     Allergies  Allergen Reactions   Lisinopril Other (See Comments)    Unsure of reaction    Penicillins Hives   Prednisone Other (See Comments)    Unsure of reaction  Aspirin Hives    Review Of Systems:  Constitutional:   No  weight loss, night sweats,  Fevers, chills, fatigue, or  lassitude.  HEENT:   No headaches,  Difficulty swallowing,  Tooth/dental problems, or  Sore throat,                No sneezing, itching, ear ache, nasal congestion, post nasal drip,   CV:  No chest pain,  Orthopnea, PND, swelling in lower extremities, anasarca, dizziness, palpitations, syncope.   GI  No heartburn, indigestion, abdominal pain, nausea, vomiting, diarrhea, change in bowel habits, loss of appetite, bloody stools.   Resp: No shortness of breath with exertion or at rest.  No excess mucus, no productive cough,  No non-productive cough,  No coughing up of blood.  No change in color of mucus.  No wheezing.  No chest wall deformity  Skin: no rash or lesions.  GU: no dysuria, change in color of urine, no urgency or frequency.  No flank pain, no hematuria   MS:  No joint pain or swelling.  No decreased range of motion.  No back pain.  Psych:  No change in mood or affect. No depression or anxiety.  No memory loss.   Vital Signs BP 134/82 (BP Location: Left Arm, Patient Position: Sitting, Cuff Size: Normal)   Pulse 71   Ht 5\' 9"  (1.753 m)   Wt 200 lb 9.6 oz (91 kg)   SpO2 96%   BMI 29.62 kg/m    Physical Exam:  General- No distress,  A&Ox3, pleasant ENT: No sinus tenderness, TM clear,  pale nasal mucosa, no oral exudate,no post nasal drip, no LAN Cardiac: S1, S2, regular rate and rhythm, no murmur Chest: No wheeze/ rales/ dullness; no accessory muscle use, no nasal flaring, no sternal retractions  Abd.: Soft Non-tender, ND, BS +. Body mass index is 29.62 kg/m. Ext: No clubbing cyanosis, edema Neuro:  normal strength, MAE x 4, A&O x 3, appropriate Skin: No rashes, warm and dry,  no obvious lesions  Psych: normal mood and behavior   Assessment/Plan Growing right lower lobe pulmonary nodule with solid component increasing in size Former smoker Plan The repeat CT scan done 04/29/2023 does show that there is been some progression of the right lower lobe pulmonary nodule we have been monitoring. We will plan for bronchoscopy with biopsies to better evaluate this nodule. Risks include bleeding, infection, puncture of the lung, and adverse reaction to anesthesia. These are very rare but we did discuss them with you today, and you have agreed to move forward. You have given your consent to moving forward with bronchoscopy with biopsy. You will need someone to drive you to the hospital the morning of the procedure, stay during the procedure, and drive you home after the procedure. Additionally they will need to stay with you for 24 hours after anesthesia to ensure that you have cleared the medication completely. You will follow-up with me 1 week after the procedure to review the results of the biopsy. This will be scheduled today on the letter that you get which gives additional information about the procedure. Please call if you have any questions. I will see you at follow-up after the procedure with the results of your biopsy. Please contact office for sooner follow up if symptoms do not improve or worsen or seek emergency care     I spent 35 minutes dedicated to the care of this patient on the date of this encounter to include pre-visit  review of records, face-to-face time with  the patient discussing conditions above, post visit ordering of testing, clinical documentation with the electronic health record, making appropriate referrals as documented, and communicating necessary information to the patient's healthcare team.    Bevelyn Ngo, NP 05/01/2023  5:12 PM

## 2023-05-01 NOTE — Patient Instructions (Signed)
 Is good to see today. The repeat CT scan done 04/29/2023 does show that there is been some progression of the right lower lobe pulmonary nodule we have been monitoring. We will plan for bronchoscopy with biopsies to better evaluate this nodule. Risks include bleeding, infection, puncture of the lung, and adverse reaction to anesthesia. These are very rare but we did discuss them with you today, and you have agreed to move forward. You have given your consent to moving forward with bronchoscopy with biopsy. You will need someone to drive you to the hospital the morning of the procedure, stay during the procedure, and drive you home after the procedure. Additionally they will need to stay with you for 24 hours after anesthesia to ensure that you have cleared the medication completely. You will follow-up with me 1 week after the procedure to review the results of the biopsy. This will be scheduled today on the letter that you get which gives additional information about the procedure. Please call if you have any questions. I will see you at follow-up after the procedure with the results of your biopsy. Please contact office for sooner follow up if symptoms do not improve or worsen or seek emergency care

## 2023-05-10 ENCOUNTER — Other Ambulatory Visit: Payer: Self-pay

## 2023-05-10 ENCOUNTER — Encounter (HOSPITAL_COMMUNITY): Payer: Self-pay | Admitting: Emergency Medicine

## 2023-05-10 NOTE — Progress Notes (Signed)
 PCP - Darrow Bussing, MD  Cardiologist - Nahser, Deloris Ping, MD   PPM/ICD - denies Device Orders - n./a Rep Notified - n/a  Chest x-ray - chest Ct 04-29-23 EKG - 09-11-22 Stress Test -  ECHO -  Cardiac Cath -  PFT- 11-22-20  CPAP - denies    Fasting Blood Sugar - Per patient's wife MD monitors blood work Checks Blood Sugar per wife patient does not check  Blood Thinner Instructions: denies Aspirin Instructions: n/a  ERAS Protcol - NPO  COVID TEST- n/a  Anesthesia review: no  Patient verbally denies any shortness of breath, fever, cough and chest pain during phone call   -------------  SDW INSTRUCTIONS given:  Your procedure is scheduled on May 13, 2023.  Report to [facility] Main Entrance "A" at 11:15 A.M., and check in at the Admitting office.  Call this number if you have problems the morning of surgery:  [phone]   Remember:  Do not eat or drink after midnight the night before your surgery     Take these medicines the morning of surgery with A SIP OF WATER  atorvastatin (LIPITOR)  omeprazole (PRILOSEC)    As of today, STOP taking any Aspirin (unless otherwise instructed by your surgeon) Aleve, Naproxen, Ibuprofen, Motrin, Advil, Goody's, BC's, all herbal medications, fish oil, and all vitamins. WHAT DO I DO ABOUT MY DIABETES MEDICATION?   Do not take oral diabetes medicines JARDIANCE Last dose 05-10-23, metFORMIN (GLUCOPHAGE-XR) pioglitazone (ACTOS) , the morning of surgery.    The day of surgery, do not take other diabetes injectables, including Byetta (exenatide), Bydureon (exenatide ER), Victoza (liraglutide), or Trulicity (dulaglutide).  If your CBG is greater than 220 mg/dL, you may take  of your sliding scale (correction) dose of insulin.   HOW TO MANAGE YOUR DIABETES BEFORE AND AFTER SURGERY  Why is it important to control my blood sugar before and after surgery? Improving blood sugar levels before and after surgery helps healing and can  limit problems. A way of improving blood sugar control is eating a healthy diet by:  Eating less sugar and carbohydrates  Increasing activity/exercise  Talking with your doctor about reaching your blood sugar goals High blood sugars (greater than 180 mg/dL) can raise your risk of infections and slow your recovery, so you will need to focus on controlling your diabetes during the weeks before surgery. Make sure that the doctor who takes care of your diabetes knows about your planned surgery including the date and location.  How do I manage my blood sugar before surgery? Check your blood sugar at least 4 times a day, starting 2 days before surgery, to make sure that the level is not too high or low.  Check your blood sugar the morning of your surgery when you wake up and every 2 hours until you get to the Short Stay unit.  If your blood sugar is less than 70 mg/dL, you will need to treat for low blood sugar: Do not take insulin. Treat a low blood sugar (less than 70 mg/dL) with  cup of clear juice (cranberry or apple), 4 glucose tablets, OR glucose gel. Recheck blood sugar in 15 minutes after treatment (to make sure it is greater than 70 mg/dL). If your blood sugar is not greater than 70 mg/dL on recheck, call [phone] for further instructions. Report your blood sugar to the short stay nurse when you get to Short Stay.  If you are admitted to the hospital after surgery: Your blood  sugar will be checked by the staff and you will probably be given insulin after surgery (instead of oral diabetes medicines) to make sure you have good blood sugar levels. The goal for blood sugar control after surgery is 80-180 mg/dL.                      Do not wear jewelry, make up, or nail polish            Do not wear lotions, powders, perfumes/colognes, or deodorant.            Do not shave 48 hours prior to surgery.  Men may shave face and neck.            Do not bring valuables to the hospital.             [facility] is not responsible for any belongings or valuables.  Do NOT Smoke (Tobacco/Vaping) 24 hours prior to your procedure If you use a CPAP at night, you may bring all equipment for your overnight stay.   Contacts, glasses, dentures or bridgework may not be worn into surgery.      For patients admitted to the hospital, discharge time will be determined by your treatment team.   Patients discharged the day of surgery will not be allowed to drive home, and someone needs to stay with them for 24 hours.    Special instructions:   [place]- Preparing For Surgery  Before surgery, you can play an important role. Because skin is not sterile, your skin needs to be as free of germs as possible. You can reduce the number of germs on your skin by washing with CHG (chlorahexidine gluconate) Soap before surgery.  CHG is an antiseptic cleaner which kills germs and bonds with the skin to continue killing germs even after washing.    Oral Hygiene is also important to reduce your risk of infection.  Remember - BRUSH YOUR TEETH THE MORNING OF SURGERY WITH YOUR REGULAR TOOTHPASTE  Please do not use if you have an allergy to CHG or antibacterial soaps. If your skin becomes reddened/irritated stop using the CHG.  Do not shave (including legs and underarms) for at least 48 hours prior to first CHG shower. It is OK to shave your face.  Please follow these instructions carefully.   Shower the NIGHT BEFORE SURGERY and the MORNING OF SURGERY with DIAL Soap.   Pat yourself dry with a CLEAN TOWEL.  Wear CLEAN PAJAMAS to bed the night before surgery  Place CLEAN SHEETS on your bed the night of your first shower and DO NOT SLEEP WITH PETS.   Day of Surgery: Please shower morning of surgery  Wear Clean/Comfortable clothing the morning of surgery Do not apply any deodorants/lotions.   Remember to brush your teeth WITH YOUR REGULAR TOOTHPASTE.   Questions were answered. Patient verbalized  understanding of instructions.

## 2023-05-13 ENCOUNTER — Encounter (HOSPITAL_COMMUNITY)
Admission: RE | Disposition: A | Discharge status: Home / Self Care | Payer: Self-pay | Source: Home / Self Care | Attending: Emergency Medicine

## 2023-05-13 ENCOUNTER — Ambulatory Visit (HOSPITAL_COMMUNITY)
Admission: RE | Admit: 2023-05-13 | Discharge: 2023-05-13 | Disposition: A | Discharge status: Home / Self Care | Attending: Emergency Medicine | Admitting: Emergency Medicine

## 2023-05-13 ENCOUNTER — Ambulatory Visit (HOSPITAL_BASED_OUTPATIENT_CLINIC_OR_DEPARTMENT_OTHER): Admitting: Anesthesiology

## 2023-05-13 ENCOUNTER — Ambulatory Visit (HOSPITAL_COMMUNITY)

## 2023-05-13 ENCOUNTER — Ambulatory Visit (HOSPITAL_COMMUNITY): Admitting: Anesthesiology

## 2023-05-13 DIAGNOSIS — Z87891 Personal history of nicotine dependence: Secondary | ICD-10-CM | POA: Diagnosis not present

## 2023-05-13 DIAGNOSIS — R911 Solitary pulmonary nodule: Secondary | ICD-10-CM | POA: Diagnosis present

## 2023-05-13 DIAGNOSIS — Z48813 Encounter for surgical aftercare following surgery on the respiratory system: Secondary | ICD-10-CM | POA: Diagnosis not present

## 2023-05-13 DIAGNOSIS — I10 Essential (primary) hypertension: Secondary | ICD-10-CM

## 2023-05-13 DIAGNOSIS — E119 Type 2 diabetes mellitus without complications: Secondary | ICD-10-CM

## 2023-05-13 DIAGNOSIS — J984 Other disorders of lung: Secondary | ICD-10-CM | POA: Diagnosis not present

## 2023-05-13 DIAGNOSIS — E7800 Pure hypercholesterolemia, unspecified: Secondary | ICD-10-CM | POA: Diagnosis not present

## 2023-05-13 DIAGNOSIS — I2510 Atherosclerotic heart disease of native coronary artery without angina pectoris: Secondary | ICD-10-CM | POA: Diagnosis not present

## 2023-05-13 HISTORY — PX: BRONCHIAL NEEDLE ASPIRATION BIOPSY: SHX5106

## 2023-05-13 HISTORY — PX: FIDUCIAL MARKER PLACEMENT: SHX6858

## 2023-05-13 HISTORY — PX: BRONCHIAL BRUSHINGS: SHX5108

## 2023-05-13 HISTORY — PX: BRONCHIAL BIOPSY: SHX5109

## 2023-05-13 LAB — CBC
HCT: 45.2 % (ref 39.0–52.0)
Hemoglobin: 14.8 g/dL (ref 13.0–17.0)
MCH: 32.3 pg (ref 26.0–34.0)
MCHC: 32.7 g/dL (ref 30.0–36.0)
MCV: 98.7 fL (ref 80.0–100.0)
Platelets: 166 10*3/uL (ref 150–400)
RBC: 4.58 MIL/uL (ref 4.22–5.81)
RDW: 13.1 % (ref 11.5–15.5)
WBC: 4.5 10*3/uL (ref 4.0–10.5)
nRBC: 0 % (ref 0.0–0.2)

## 2023-05-13 LAB — BASIC METABOLIC PANEL WITH GFR
Anion gap: 10 (ref 5–15)
BUN: 14 mg/dL (ref 8–23)
CO2: 19 mmol/L — ABNORMAL LOW (ref 22–32)
Calcium: 9 mg/dL (ref 8.9–10.3)
Chloride: 110 mmol/L (ref 98–111)
Creatinine, Ser: 0.8 mg/dL (ref 0.61–1.24)
GFR, Estimated: >60 mL/min (ref >60)
Glucose, Bld: 136 mg/dL — ABNORMAL HIGH (ref 70–99)
Potassium: 4.9 mmol/L (ref 3.5–5.1)
Sodium: 139 mmol/L (ref 135–145)

## 2023-05-13 LAB — GLUCOSE, CAPILLARY
Glucose-Capillary: 119 mg/dL — ABNORMAL HIGH (ref 70–99)
Glucose-Capillary: 127 mg/dL — ABNORMAL HIGH (ref 70–99)

## 2023-05-13 SURGERY — BRONCHOSCOPY, WITH BIOPSY USING ELECTROMAGNETIC NAVIGATION
Anesthesia: General

## 2023-05-13 MED ORDER — ROCURONIUM BROMIDE 10 MG/ML (PF) SYRINGE
PREFILLED_SYRINGE | INTRAVENOUS | Status: DC | PRN
  Administered 2023-05-13: 20 mg via INTRAVENOUS
  Administered 2023-05-13: 50 mg via INTRAVENOUS

## 2023-05-13 MED ORDER — LACTATED RINGERS IV SOLN: INTRAVENOUS | Status: DC

## 2023-05-13 MED ORDER — FENTANYL CITRATE (PF) 100 MCG/2ML IJ SOLN
INTRAMUSCULAR | Status: AC
  Filled 2023-05-13: qty 2

## 2023-05-13 MED ORDER — SUGAMMADEX SODIUM 200 MG/2ML IV SOLN
INTRAVENOUS | Status: DC | PRN
  Administered 2023-05-13: 200 mg via INTRAVENOUS

## 2023-05-13 MED ORDER — FENTANYL CITRATE (PF) 250 MCG/5ML IJ SOLN
INTRAMUSCULAR | Status: DC | PRN
Start: 2023-05-13 — End: 2023-05-13
  Administered 2023-05-13 (×2): 50 ug via INTRAVENOUS

## 2023-05-13 MED ORDER — OXYCODONE HCL 5 MG PO TABS
5.0000 mg | ORAL_TABLET | Freq: Once | ORAL | Status: DC | PRN
  Filled 2023-05-13: qty 1

## 2023-05-13 MED ORDER — OXYCODONE HCL 5 MG/5ML PO SOLN
5.0000 mg | Freq: Once | ORAL | Status: DC | PRN
  Filled 2023-05-13: qty 5

## 2023-05-13 MED ORDER — INSULIN ASPART 100 UNIT/ML IJ SOLN
0.0000 [IU] | INTRAMUSCULAR | Status: DC | PRN
  Filled 2023-05-13: qty 0.07

## 2023-05-13 MED ORDER — ONDANSETRON HCL 4 MG/2ML IJ SOLN: 4.0000 mg | Freq: Four times a day (QID) | INTRAMUSCULAR | Status: DC | PRN

## 2023-05-13 MED ORDER — ONDANSETRON HCL 4 MG/2ML IJ SOLN
INTRAMUSCULAR | Status: DC | PRN
  Administered 2023-05-13: 4 mg via INTRAVENOUS

## 2023-05-13 MED ORDER — CHLORHEXIDINE GLUCONATE 0.12 % MT SOLN
OROMUCOSAL | Status: AC
  Administered 2023-05-13: 15 mL via OROMUCOSAL
  Filled 2023-05-13: qty 15

## 2023-05-13 MED ORDER — PROPOFOL 500 MG/50ML IV EMUL
INTRAVENOUS | Status: DC | PRN
  Administered 2023-05-13: 125 ug/kg/min via INTRAVENOUS

## 2023-05-13 MED ORDER — CHLORHEXIDINE GLUCONATE 0.12 % MT SOLN: 15.0000 mL | Freq: Once | OROMUCOSAL | Status: AC

## 2023-05-13 MED ORDER — FENTANYL CITRATE (PF) 100 MCG/2ML IJ SOLN: 25.0000 ug | INTRAMUSCULAR | Status: DC | PRN

## 2023-05-13 MED ORDER — LIDOCAINE 2% (20 MG/ML) 5 ML SYRINGE
INTRAMUSCULAR | Status: DC | PRN
  Administered 2023-05-13: 80 mg via INTRAVENOUS

## 2023-05-13 MED ORDER — PHENYLEPHRINE HCL-NACL 20-0.9 MG/250ML-% IV SOLN
INTRAVENOUS | Status: DC | PRN
  Administered 2023-05-13: 40 ug/min via INTRAVENOUS

## 2023-05-13 MED ORDER — PHENYLEPHRINE 80 MCG/ML (10ML) SYRINGE FOR IV PUSH (FOR BLOOD PRESSURE SUPPORT)
PREFILLED_SYRINGE | INTRAVENOUS | Status: DC | PRN
  Administered 2023-05-13: 80 ug via INTRAVENOUS

## 2023-05-13 MED ORDER — PROPOFOL 10 MG/ML IV BOLUS
INTRAVENOUS | Status: DC | PRN
  Administered 2023-05-13: 120 mg via INTRAVENOUS

## 2023-05-13 SURGICAL SUPPLY — 1 items: fiducial IMPLANT

## 2023-05-13 NOTE — Interval H&P Note (Signed)
 History and Physical Interval Note:  05/13/2023 11:46 AM  Tristan Sims  has presented today for surgery, with the diagnosis of lung nodule.  The various methods of treatment have been discussed with the patient and family. After consideration of risks, benefits and other options for treatment, the patient has consented to  Procedure(s) with comments: BRONCHOSCOPY, WITH BIOPSY USING ELECTROMAGNETIC NAVIGATION (N/A) - WITH FLUORO as a surgical intervention.  The patient's history has been reviewed, patient examined, no change in status, stable for surgery.  I have reviewed the patient's chart and labs.  Questions were answered to the patient's satisfaction.     Leslye Peer

## 2023-05-13 NOTE — Transfer of Care (Signed)
 Immediate Anesthesia Transfer of Care Note  Patient: Tristan Sims  Procedure(s) Performed: BRONCHOSCOPY, WITH BIOPSY USING ELECTROMAGNETIC NAVIGATION BRONCHOSCOPY, WITH NEEDLE ASPIRATION BIOPSY BRONCHOSCOPY, WITH BIOPSY BRONCHOSCOPY, WITH BRUSH BIOPSY INSERTION, FIDUCIAL MARKERS  Patient Location: PACU  Anesthesia Type:General  Level of Consciousness: awake, oriented, and drowsy  Airway & Oxygen Therapy: Patient connected to nasal cannula oxygen  Post-op Assessment: Report given to RN, Post -op Vital signs reviewed and stable, and Patient moving all extremities  Post vital signs: Reviewed and stable  Last Vitals:  Vitals Value Taken Time  BP 100/56 05/13/23 1445  Temp 36.7 C 05/13/23 1445  Pulse 61 05/13/23 1451  Resp 15 05/13/23 1451  SpO2 97 % 05/13/23 1451  Vitals shown include unfiled device data.  Last Pain:  Vitals:   05/13/23 1445  TempSrc:   PainSc: 0-No pain         Complications: No notable events documented.

## 2023-05-13 NOTE — Anesthesia Procedure Notes (Signed)
 Procedure Name: Intubation Date/Time: 05/13/2023 1:30 PM  Performed by: Kayleen Memos, CRNAPre-anesthesia Checklist: Patient identified, Emergency Drugs available, Suction available and Patient being monitored Patient Re-evaluated:Patient Re-evaluated prior to induction Oxygen Delivery Method: Circle System Utilized Preoxygenation: Pre-oxygenation with 100% oxygen Induction Type: IV induction Ventilation: Mask ventilation without difficulty Laryngoscope Size: Mac and 4 Grade View: Grade I Tube type: Oral Tube size: 8.5 mm Number of attempts: 1 Airway Equipment and Method: Stylet and Oral airway Placement Confirmation: ETT inserted through vocal cords under direct vision, positive ETCO2 and breath sounds checked- equal and bilateral Secured at: 23 cm Tube secured with: Tape Dental Injury: Teeth and Oropharynx as per pre-operative assessment

## 2023-05-13 NOTE — Op Note (Signed)
 Video Bronchoscopy with Robotic Assisted Bronchoscopic Navigation   Date of Operation: 05/13/2023   Pre-op Diagnosis: Right upper lobe nodule  Post-op Diagnosis: Same.  Surgeon: Levy Pupa  Assistants: None  Anesthesia: General endotracheal anesthesia  Operation: Flexible video fiberoptic bronchoscopy with robotic assistance and biopsies.  Estimated Blood Loss: Minimal  Complications: None  Indications and History: Tristan Sims is a 78 y.o. male with history of pulm tobacco use.  He has a mixed density right lower lobe pulmonary nodule that has increased slightly in size and density on serial imaging.  Recommendation made to achieve tissue diagnosis.  Robotic assisted navigational bronchoscopy. The risks, benefits, complications, treatment options and expected outcomes were discussed with the patient.  The possibilities of pneumothorax, pneumonia, reaction to medication, pulmonary aspiration, perforation of a viscus, bleeding, failure to diagnose a condition and creating a complication requiring transfusion or operation were discussed with the patient who freely signed the consent.    Description of Procedure: The patient was seen in the Preoperative Area, was examined and was deemed appropriate to proceed.  The patient was taken to [facility] endoscopy room 3, identified as Tristan Sims and the procedure verified as Flexible Video Fiberoptic Bronchoscopy.  A Time Out was held and the above information confirmed.   Prior to the date of the procedure a high-resolution CT scan of the chest was performed. Utilizing ION software program a virtual tracheobronchial tree was generated to allow the creation of distinct navigation pathways to the patient's parenchymal abnormalities. After being taken to the operating room general anesthesia was initiated and the patient  was orally intubated. The video fiberoptic bronchoscope was introduced via the endotracheal tube and a general inspection was performed which  showed normal right and left lung anatomy. Aspiration of the bilateral mainstems was completed to remove any remaining secretions. Robotic catheter inserted into patient's endotracheal tube.   Target #1 right lower lobe pulmonary nodule: The distinct navigation pathways prepared prior to this procedure were then utilized to navigate to patient's lesion identified on CT scan. The robotic catheter was secured into place and the vision probe was withdrawn.  Lesion location was approximated using fluoroscopy.  Local registration and targeting was performed using Cios three-dimensional imaging.  Unfortunately due to atelectasis and groundglass nature of the nodule visualization was difficult.  Recruitment breaths were performed but the nodule was never fully visualized on cone beam.  The nodule location was approximated based on the navigation pathway.  Under fluoroscopic guidance transbronchial needle brushings, transbronchial needle biopsies, and transbronchial forceps biopsies were performed to be sent for cytology and pathology.  Under fluoroscopic guidance a single fiducial marker was placed.   At the end of the procedure a general airway inspection was performed and there was no evidence of active bleeding. The bronchoscope was removed.  The patient tolerated the procedure well. There was no significant blood loss and there were no obvious complications. A post-procedural chest x-ray is pending.  Samples Target #1: 1. Transbronchial needle brushings from right lower lobe nodule 2. Transbronchial Wang needle biopsies from right lower lobe nodule 3. Transbronchial forceps biopsies from right lower lobe nodule  Plans:  The patient will be discharged from the PACU to home when recovered from anesthesia and after chest x-ray is reviewed. We will review the cytology, pathology and microbiology results with the patient when they become available. Outpatient followup will be with [name], NP and Dr. Delton Coombes.    Levy Pupa, MD, PhD 05/13/2023, 2:32 PM [facility] [phone] or if no answer before 7:00PM call [phone] For any issues after 7:00PM please call eLink [phone]

## 2023-05-13 NOTE — Anesthesia Preprocedure Evaluation (Signed)
 Anesthesia Evaluation  Patient identified by MRN, date of birth, ID band Patient awake    Reviewed: Allergy & Precautions, H&P , NPO status , Patient's Chart, lab work & pertinent test results  Airway Mallampati: II   Neck ROM: full    Dental   Pulmonary former smoker Lung nodule   breath sounds clear to auscultation       Cardiovascular hypertension,  Rhythm:regular Rate:Normal     Neuro/Psych    GI/Hepatic   Endo/Other  diabetes, Type 2    Renal/GU      Musculoskeletal   Abdominal   Peds  Hematology   Anesthesia Other Findings   Reproductive/Obstetrics                             Anesthesia Physical Anesthesia Plan  ASA: 3  Anesthesia Plan: General   Post-op Pain Management:    Induction: Intravenous  PONV Risk Score and Plan: 2 and Ondansetron, Dexamethasone and Treatment may vary due to age or medical condition  Airway Management Planned: Oral ETT  Additional Equipment:   Intra-op Plan:   Post-operative Plan: Extubation in OR  Informed Consent: I have reviewed the patients History and Physical, chart, labs and discussed the procedure including the risks, benefits and alternatives for the proposed anesthesia with the patient or authorized representative who has indicated his/her understanding and acceptance.     Dental advisory given  Plan Discussed with: CRNA, Anesthesiologist and Surgeon  Anesthesia Plan Comments:        Anesthesia Quick Evaluation

## 2023-05-13 NOTE — Discharge Instructions (Addendum)
 Flexible Bronchoscopy, Care After This sheet gives you information about how to care for yourself after your test. Your doctor may also give you more specific instructions. If you have problems or questions, contact your doctor. Follow these instructions at home: Eating and drinking When your numbness is gone and your cough and gag reflexes have come back, you may: Eat only soft foods. Slowly drink liquids. When you get home after the test, go back to your normal diet. Driving Do not drive for 24 hours if you were given a medicine to help you relax (sedative). Do not drive or use heavy machinery while taking prescription pain medicine. General instructions  Take over-the-counter and prescription medicines only as told by your doctor. Return to your normal activities as told. Ask what activities are safe for you. Do not use any products that have nicotine or tobacco in them. This includes cigarettes and e-cigarettes. If you need help quitting, ask your doctor. Keep all follow-up visits as told by your doctor. This is important. It is very important if you had a tissue sample (biopsy) taken. Get help right away if: You have shortness of breath that gets worse. You get light-headed. You feel like you are going to pass out (faint). You have chest pain. You cough up: More than a little blood. More blood than before. Summary Do not eat or drink anything (not even water) for 2 hours after your test, or until your numbing medicine wears off. Do not use cigarettes. Do not use e-cigarettes. Get help right away if you have chest pain.  Please call our office with any questions or concerns.  [phone].  This information is not intended to replace advice given to you by your health care provider. Make sure you discuss any questions you have with your health care provider. Document Released: 11/19/2008 Document Revised: 01/04/2017 Document Reviewed: 02/10/2016 Elsevier Patient Education  2020  [employer].

## 2023-05-14 LAB — CYTOLOGY - NON PAP

## 2023-05-14 NOTE — Anesthesia Postprocedure Evaluation (Signed)
 Anesthesia Post Note  Patient: Jeff Frieden  Procedure(s) Performed: BRONCHOSCOPY, WITH BIOPSY USING ELECTROMAGNETIC NAVIGATION BRONCHOSCOPY, WITH NEEDLE ASPIRATION BIOPSY BRONCHOSCOPY, WITH BIOPSY BRONCHOSCOPY, WITH BRUSH BIOPSY INSERTION, FIDUCIAL MARKERS     Patient location during evaluation: PACU Anesthesia Type: General Level of consciousness: awake and alert Pain management: pain level controlled Vital Signs Assessment: post-procedure vital signs reviewed and stable Respiratory status: spontaneous breathing, nonlabored ventilation, respiratory function stable and patient connected to nasal cannula oxygen Cardiovascular status: blood pressure returned to baseline and stable Postop Assessment: no apparent nausea or vomiting Anesthetic complications: no   No notable events documented.  Last Vitals:  Vitals:   05/13/23 1500 05/13/23 1515  BP: 94/73 102/69  Pulse: 65 60  Resp: 18 17  Temp:    SpO2: 94% 93%    Last Pain:  Vitals:   05/13/23 1515  TempSrc:   PainSc: 0-No pain                 Taunja Brickner S

## 2023-05-15 ENCOUNTER — Encounter (HOSPITAL_COMMUNITY): Payer: Self-pay | Admitting: Emergency Medicine

## 2023-05-22 ENCOUNTER — Encounter: Payer: Self-pay | Admitting: Acute Care

## 2023-05-22 ENCOUNTER — Ambulatory Visit: Admitting: Acute Care

## 2023-05-22 VITALS — BP 141/91 | HR 74 | Ht 69.0 in | Wt 195.0 lb

## 2023-05-22 DIAGNOSIS — Z9889 Other specified postprocedural states: Secondary | ICD-10-CM | POA: Diagnosis not present

## 2023-05-22 DIAGNOSIS — R911 Solitary pulmonary nodule: Secondary | ICD-10-CM | POA: Diagnosis not present

## 2023-05-22 DIAGNOSIS — Z87891 Personal history of nicotine dependence: Secondary | ICD-10-CM

## 2023-05-22 NOTE — Patient Instructions (Addendum)
 It is good to see you today. Your biopsy did not show malignant cells.  We will do a short term follow up CT Chest to follow the nodule closely.This will be due first week of August.  You will get a call to get this scheduled.  You will follow up in the office with me 1-2 weeks after the scan to review the results. Call sooner for any hemoptysis or unexplained weight loss.  Please contact office for sooner follow up if symptoms do not improve or worsen or seek emergency care

## 2023-05-22 NOTE — Progress Notes (Signed)
 History of Present Illness Tristan Sims is a 78 y.o. male former smoker ( Quit 2019 with a referred 11/2020 for lung nodule by Dr. Candance Certain. with a 106 pack year smoking history. Initially followed by Dr. Thelda Finney. Will now be followed by Dr. Baldwin Levee.   Synopsis  78 year old gentleman, past medical history of diabetes, hypertension, hypercholesterolemia and tobacco use.  He quit smoking in 2019.  Had a 50+ pack year history of smoking.  He is originally from [place].  He is still working in Insurance claims handler.  He had a lung cancer screening CT that showed a slowly enlarging right lower lobe pulmonary nodule.  This went from a subsolid state to a more solid state concerning for a primary bronchogenic carcinoma.  Patient has had a PET scan complete with no other distant metastasis.  As well as pulmonary function test which revealed normal spirometry, no significant obstruction Ratio of 75, FEV1 of 2.6 L, TLC 77%, DLCO 76% predicted.  Patient from a respiratory standpoint has no significant complaints and is able to complete all of his activities of daily living.  He does have a planned trip coming up at the beginning of December to visit family in [place] for approximately 1 month.   He was scheduled for robotic bronchoscopy with biopsies 02/2021 , and this was complicated with a Covid diagnosis on the table, and hypoxia, so the procedure had to be aborted. Dr. Thelda Finney has been following the nodule for stability with   CT Scans . He was seen last  in 04/2022 by Dr. Thelda Finney. At that time imaging showed the nodule was a bit smaller. Plan was for a 12 month follow up scan.   He was seen in the office 05/01/2023 to review a follow up CT Chest. This showed slowly growing right lower lobe, plan was to move forward with biopsy.     05/22/2023 Tristan Sims  is a 78 year old male who presents for follow-up after navigational assisted bronchoscopy with biopsies. He states he has done well after the procedure. No fever,  discolored secretions, or worsening shortness of breath. No issues with anesthesia, such as nausea, vomiting, or headaches.  We discussed the results of his biopsy. The fine needle biopsy was noted to be non-diagnostic material, and the RLL brushing showed no malignant cells.  Post-procedure, he feels better, states he is breathing better. He attributes this to the suctioning of secretions during the procedure. He experienced a small amount of bleeding on the second day post-procedure, which resolved on its own.   I explained that we will need to do close surveillance imaging of the RLL nodule to monitor for growth. We discussed non-diagnostic material is not the same as non malignant. He verbalized understanding.   He is currently using a budesonide nebulizer twice daily and an albuterol  nebulizer. He reports no new symptoms , no hemoptysis  or unexplained weight loss.  Test Results: Cytology 05/13/2023 A. LUNG, RLL, FINE NEEDLE ASPIRATION:  - Nondiagnostic material   B. LUNG, RLL, BRUSHING:  - No malignant cells identified   CT Chest 04/29/2023 Part solid right lower lobe nodule measuring 1.1 by 1.2 by 0.6 cm on image 95 series 5, solid portion measures 0.5 by 0.4 by 0.4 cm. 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   Chronic scarring inferiorly in the right middle lobe. Mild scarring in the posterior basal segments of the lower lobes. Mild airway  plugging in the posterior basal segment right lower lobe. Small calcified nodule centrally in the left lower lobe compatible with calcified granuloma.   Peribronchovascular nodule in the left upper lobe measures by 3 mm on image 77 series 5 and is stable from 2020, and considered benign.   Upper Abdomen: No change or acute findings.   Musculoskeletal: Thoracic spondylosis.   IMPRESSION: 1. Part solid right lower lobe nodule  measuring 1.1 by 1.2 by 0.6 cm, solid portion measures 0.5 by 0.4 by 0.4 cm. 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 This was negative on the 2022 PET-CT when it was smaller. Consider tissue diagnosis or resection. 2. Coronary, aortic arch, and branch vessel atherosclerotic vascular disease. 3. Ascending aortic aneurysm 4.1 cm in diameter.   CT Chest 04/29/2023 Part solid right lower lobe nodule measuring 1.1 by 1.2 by 0.6 cm on image 95 series 5, solid portion measures 0.5 by 0.4 by 0.4 cm. 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   Chronic scarring inferiorly in the right middle lobe. Mild scarring in the posterior basal segments of the lower lobes. Mild airway plugging in the posterior basal segment right lower lobe. Small calcified nodule centrally in the left lower lobe compatible with calcified granuloma.   Peribronchovascular nodule in the left upper lobe measures by 3 mm on image 77 series 5 and is stable from 2020, and considered benign.   Upper Abdomen: No change or acute findings.   Musculoskeletal: Thoracic spondylosis.   IMPRESSION: 1. Part solid right lower lobe nodule measuring 1.1 by 1.2 by 0.6 cm, solid portion measures 0.5 by 0.4 by 0.4 cm. 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 This was negative on the 2022 PET-CT when it was smaller. Consider tissue diagnosis or resection. 2. Coronary, aortic arch, and branch vessel atherosclerotic vascular disease. 3. Ascending aortic aneurysm 4.1 cm in diameter. Recommend annual imaging followup by CTA or MRA. This recommendation follows  2010 ACCF/AHA/AATS/ACR/ASA/SCA/SCAI/SIR/STS/SVM Guidelines for the Diagnosis and Management of Patients with Thoracic Aortic Disease. Circulation. 2010; 121: O962-X528. Aortic aneurysm NOS (ICD10-I71.9) 4. Aortic Atherosclerosis (ICD10-I70.0) and Emphysema (ICD10-J43.9).   11/22/2020 PET scan The slow growing right lower lobe pulmonary nodule demonstrates no hypermetabolic activity. While reassuring, this does not exclude low-grade malignancy such as adenocarcinoma. 2. Focal hypermetabolic activity at the left prostatic apex. Recommend correlation with serum PSA levels and consideration of urology consultation. 3. Incidental findings as above, including hepatic steatosis, probable hepatic and left renal cysts, coronary andAortic Atherosclerosis (ICD10-I70.0) and Emphysema (ICD10-J43.9).    Latest Ref Rng & Units 05/13/2023   12:26 PM 02/15/2021   10:00 AM  CBC  WBC 4.0 - 10.5 K/uL 4.5  6.2   Hemoglobin 13.0 - 17.0 g/dL 41.3  24.4   Hematocrit 39.0 - 52.0 % 45.2  44.1   Platelets 150 - 400 K/uL 166  245        Latest Ref Rng & Units 05/13/2023   12:26 PM 02/15/2021   10:00 AM 08/09/2020    9:08 AM  BMP  Glucose 70 - 99 mg/dL 010  272  536   BUN 8 - 23 mg/dL 14  15  34   Creatinine 0.61 - 1.24 mg/dL 6.44  0.34  7.42  BUN/Creat Ratio 10 - 24   28   Sodium 135 - 145 mmol/L 139  138  141   Potassium 3.5 - 5.1 mmol/L 4.9  3.9  4.9   Chloride 98 - 111 mmol/L 110  108  106   CO2 22 - 32 mmol/L 19  20  21    Calcium 8.9 - 10.3 mg/dL 9.0  9.2  9.1     BNP No results found for: "BNP"  ProBNP No results found for: "PROBNP"  PFT    Component Value Date/Time   FEV1PRE 2.44 11/22/2020 1550   FEV1POST 2.60 11/22/2020 1550   FVCPRE 3.23 11/22/2020 1550   FVCPOST 3.45 11/22/2020 1550   TLC 5.43 11/22/2020 1550   DLCOUNC 19.39 11/22/2020 1550   PREFEV1FVCRT 76 11/22/2020 1550   PSTFEV1FVCRT 75 11/22/2020 1550    DG Chest Port 1 View Result Date: 05/13/2023 CLINICAL DATA:  Status  post bronchoscopy with biopsy. EXAM: PORTABLE CHEST 1 VIEW COMPARISON:  CT 04/29/2023 FINDINGS: No evidence of pneumothorax post bronchoscopy and biopsy. No confluent airspace disease. The heart is normal in size. Pulmonary nodules on CT are not well seen by radiograph. No pulmonary edema. No significant pleural effusion. IMPRESSION: No pneumothorax post bronchoscopy. Electronically Signed   By: Chadwick Colonel M.D.   On: 05/13/2023 16:36   DG C-ARM BRONCHOSCOPY Result Date: 05/13/2023 C-ARM BRONCHOSCOPY: Fluoroscopy was utilized by the requesting physician.  No radiographic interpretation.   CT Chest Wo Contrast Result Date: 04/29/2023 CLINICAL DATA:  Lung nodule surveillance EXAM: CT CHEST WITHOUT CONTRAST TECHNIQUE: Multidetector CT imaging of the chest was performed following the standard protocol without IV contrast. RADIATION DOSE REDUCTION: This exam was performed according to the departmental dose-optimization program which includes automated exposure control, adjustment of the mA and/or kV according to patient size and/or use of iterative reconstruction technique. COMPARISON:  04/11/2022 FINDINGS: Cardiovascular: Coronary, aortic arch, and branch vessel atherosclerotic vascular disease. Ascending aortic aneurysm 4.1 cm in diameter. Mediastinum/Nodes: Unremarkable Lungs/Pleura: Centrilobular emphysema. Part solid right lower lobe nodule measuring 1.1 by 1.2 by 0.6 cm on image 95 series 5, solid portion measures 0.5 by 0.4 by 0.4 cm. 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 Chronic scarring inferiorly in the right middle lobe. Mild scarring in the posterior basal segments of the lower lobes. Mild airway plugging in the posterior basal segment right lower lobe. Small calcified nodule centrally in the left lower lobe compatible with calcified granuloma. Peribronchovascular nodule in the left  upper lobe measures by 3 mm on image 77 series 5 and is stable from 2020, and considered benign. Upper Abdomen: No change or acute findings. Musculoskeletal: Thoracic spondylosis. IMPRESSION: 1. Part solid right lower lobe nodule measuring 1.1 by 1.2 by 0.6 cm, solid portion measures 0.5 by 0.4 by 0.4 cm. 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 This was negative on the 2022 PET-CT when it was smaller. Consider tissue diagnosis or resection. 2. Coronary, aortic arch, and branch vessel atherosclerotic vascular disease. 3. Ascending aortic aneurysm 4.1 cm in diameter. Recommend annual imaging followup by CTA or MRA. This recommendation follows 2010 ACCF/AHA/AATS/ACR/ASA/SCA/SCAI/SIR/STS/SVM Guidelines for the Diagnosis and Management of Patients with Thoracic Aortic Disease. Circulation. 2010; 121: Z610-R604. Aortic aneurysm NOS (ICD10-I71.9) 4. Aortic Atherosclerosis (ICD10-I70.0) and Emphysema (ICD10-J43.9). Electronically Signed   By: Freida Jes M.D.   On:  04/29/2023 19:53     Past medical hx Past Medical History:  Diagnosis Date   Adenomatous polyps    Ascending aortic aneurysm (HCC)    CAD (coronary artery disease)    DM (diabetes mellitus), type 2 (HCC)    HTN (hypertension)    Hypercholesteremia    Tobacco dependence      Social History   Tobacco Use   Smoking status: Former    Current packs/day: 0.00    Average packs/day: 2.0 packs/day for 53.0 years (106.0 ttl pk-yrs)    Types: Cigarettes    Start date: 89    Quit date: 2019    Years since quitting: 6.2    Passive exposure: Never   Smokeless tobacco: Never   Tobacco comments:    Quit smoking 2019  Vaping Use   Vaping status: Never Used  Substance Use Topics   Alcohol use: Not Currently   Drug use: Never    TristanStipe reports that he quit smoking about 6 years ago. His smoking use included  cigarettes. He started smoking about 59 years ago. He has a 106 pack-year smoking history. He has never been exposed to tobacco smoke. He has never used smokeless tobacco. He reports that he does not currently use alcohol. He reports that he does not use drugs.  Tobacco Cessation: Counseling given: Not Answered Tobacco comments: Quit smoking 2019 Former smoker , quit 2019 with a 106 pack year smoking history  Past surgical hx, Family hx, Social hx all reviewed.  Current Outpatient Medications on File Prior to Visit  Medication Sig   atorvastatin (LIPITOR) 10 MG tablet Take 10 mg by mouth daily.   Blood Glucose Monitoring Suppl (CONTOUR NEXT ONE) DEVI 1 Device by Does not apply route daily.   glucose blood (ONETOUCH VERIO) test strip 1 each by Other route daily. And lancets 1/day   irbesartan (AVAPRO) 75 MG tablet Take 75 mg by mouth daily.   JARDIANCE  25 MG TABS tablet Take 1 tablet (25 mg total) by mouth daily.   metFORMIN  (GLUCOPHAGE -XR) 500 MG 24 hr tablet Take 2 tablets (1,000 mg total) by mouth daily.   omeprazole (PRILOSEC) 40 MG capsule Take 40 mg by mouth every morning.   pioglitazone  (ACTOS ) 45 MG tablet Take 1 tablet (45 mg total) by mouth daily.   No current facility-administered medications on file prior to visit.     Allergies  Allergen Reactions   Lisinopril Other (See Comments)    Unsure of reaction    Penicillins Hives   Prednisone  Other (See Comments)    Unsure of reaction    Aspirin Hives    Review Of Systems:  Constitutional:   No  weight loss, night sweats,  Fevers, chills, fatigue, or  lassitude.  HEENT:   No headaches,  Difficulty swallowing,  Tooth/dental problems, or  Sore throat,                No sneezing, itching, ear ache, nasal congestion, post nasal drip,   CV:  No chest pain,  Orthopnea, PND, swelling in lower extremities, anasarca, dizziness, palpitations, syncope.   GI  No heartburn, indigestion, abdominal pain, nausea, vomiting, diarrhea,  change in bowel habits, loss of appetite, bloody stools.   Resp: No shortness of breath with exertion or at rest.  No excess mucus, no productive cough,  No non-productive cough,  No coughing up of blood.  No change in color of mucus.  No wheezing.  No chest wall deformity  Skin: no rash  or lesions.  GU: no dysuria, change in color of urine, no urgency or frequency.  No flank pain, no hematuria   MS:  No joint pain or swelling.  No decreased range of motion.  No back pain.  Psych:  No change in mood or affect. No depression or anxiety.  No memory loss.   Vital Signs BP (!) 141/91 (BP Location: Left Arm, Patient Position: Sitting, Cuff Size: Normal)   Pulse 74   Ht 5\' 9"  (1.753 m)   Wt 195 lb (88.5 kg)   SpO2 97%   BMI 28.80 kg/m    Physical Exam:  General- No distress,  A&Ox3, pleasant ENT: No sinus tenderness, TM clear, pale nasal mucosa, no oral exudate,no post nasal drip, no LAN Cardiac: S1, S2, regular rate and rhythm, no murmur Chest: No wheeze/ rales/ dullness; no accessory muscle use, no nasal flaring, no sternal retractions, diminished per bases Abd.: Soft Non-tender, ND, BS +, Body mass index is 28.8 kg/m.  Ext: No clubbing cyanosis, edema, no obvious deformities Neuro:  normal strength, MAE x 4, A&O x 3, appropriate Skin: No rashes, warm and dry, no obvious lesions  Psych: normal mood and behavior   Assessment/Plan Post bronchoscopy with biopsies  Non diagnostic material RLL FNA No Malignant cells RLL brushing Former smoker , 106 pack years Plan our biopsy did not show malignant cells.  We will do a short term follow up CT Chest to follow the nodule closely. This will be due first week of August.  You will get a call to get this scheduled.  You will follow up in the office with me 1-2 weeks after the scan to review the results. Call sooner for any hemoptysis or unexplained weight loss.  Please contact office for sooner follow up if symptoms do not improve or  worsen or seek emergency care    I spent 25 minutes dedicated to the care of this patient on the date of this encounter to include pre-visit review of records, face-to-face time with the patient discussing conditions above, post visit ordering of testing, clinical documentation with the electronic health record, making appropriate referrals as documented, and communicating necessary information to the patient's healthcare team.   Raejean Bullock, NP 05/22/2023  9:43 AM

## 2023-05-27 ENCOUNTER — Encounter: Payer: Self-pay | Admitting: Acute Care

## 2023-06-03 DIAGNOSIS — T7840XA Allergy, unspecified, initial encounter: Secondary | ICD-10-CM | POA: Diagnosis not present

## 2023-06-07 DIAGNOSIS — R7989 Other specified abnormal findings of blood chemistry: Secondary | ICD-10-CM | POA: Diagnosis not present

## 2023-06-07 DIAGNOSIS — R945 Abnormal results of liver function studies: Secondary | ICD-10-CM | POA: Diagnosis not present

## 2023-06-07 DIAGNOSIS — K7689 Other specified diseases of liver: Secondary | ICD-10-CM | POA: Diagnosis not present

## 2023-06-07 DIAGNOSIS — N281 Cyst of kidney, acquired: Secondary | ICD-10-CM | POA: Diagnosis not present

## 2023-06-07 DIAGNOSIS — K8020 Calculus of gallbladder without cholecystitis without obstruction: Secondary | ICD-10-CM | POA: Diagnosis not present

## 2023-09-03 DIAGNOSIS — I2510 Atherosclerotic heart disease of native coronary artery without angina pectoris: Secondary | ICD-10-CM | POA: Diagnosis not present

## 2023-09-03 DIAGNOSIS — E782 Mixed hyperlipidemia: Secondary | ICD-10-CM | POA: Diagnosis not present

## 2023-09-03 DIAGNOSIS — E1165 Type 2 diabetes mellitus with hyperglycemia: Secondary | ICD-10-CM | POA: Diagnosis not present

## 2023-09-03 DIAGNOSIS — R252 Cramp and spasm: Secondary | ICD-10-CM | POA: Diagnosis not present

## 2023-09-03 DIAGNOSIS — I10 Essential (primary) hypertension: Secondary | ICD-10-CM | POA: Diagnosis not present

## 2023-09-06 ENCOUNTER — Ambulatory Visit
Admission: RE | Admit: 2023-09-06 | Discharge: 2023-09-06 | Disposition: A | Discharge status: Home / Self Care | Source: Ambulatory Visit | Attending: Acute Care | Admitting: Acute Care

## 2023-09-06 DIAGNOSIS — R911 Solitary pulmonary nodule: Secondary | ICD-10-CM

## 2023-09-06 DIAGNOSIS — J432 Centrilobular emphysema: Secondary | ICD-10-CM | POA: Diagnosis not present

## 2023-09-20 ENCOUNTER — Ambulatory Visit: Admitting: Acute Care

## 2023-10-01 ENCOUNTER — Encounter: Payer: Self-pay | Admitting: Acute Care

## 2023-10-01 ENCOUNTER — Ambulatory Visit (INDEPENDENT_AMBULATORY_CARE_PROVIDER_SITE_OTHER): Admitting: Acute Care

## 2023-10-01 VITALS — BP 109/75 | HR 88 | Temp 97.8°F | Ht 69.0 in | Wt 194.0 lb

## 2023-10-01 DIAGNOSIS — R911 Solitary pulmonary nodule: Secondary | ICD-10-CM

## 2023-10-01 NOTE — Progress Notes (Signed)
 History of Present Illness Tristan Sims is a 78 y.o. male former smoker ( Quit 2019 with a referred 11/2020 for lung nodule by Dr. Regino. with a 106 pack year smoking history. Initially followed by Dr. Brenna. Will now be followed by Dr. Shelah.  Synopsis  78 year old gentleman, past medical history of diabetes, hypertension, hypercholesterolemia and tobacco use.  He quit smoking in 2019.  Had a 50+ pack year history of smoking.  He is originally from [place].  He is still working in Insurance claims handler.  He had a lung cancer screening CT that showed a slowly enlarging right lower lobe pulmonary nodule.  This went from a subsolid state to a more solid state concerning for a primary bronchogenic carcinoma.  Patient has had a PET scan complete with no other distant metastasis.  As well as pulmonary function test which revealed normal spirometry, no significant obstruction Ratio of 75, FEV1 of 2.6 L, TLC 77%, DLCO 76% predicted.  Patient from a respiratory standpoint has no significant complaints and is able to complete all of his activities of daily living.  Biopsy done 05/2022 was  non diagnostic per the FNA, and brushing was negative for malignancy.    10/01/2023 Discussed the use of AI scribe software for clinical note transcription with the patient, who gave verbal consent to proceed.  History of Present Illness Tristan Sims is a 78 year old male who presents for review of a 3 month follow up CT chest as surveillance of  a right lower lobe pulmonary nodule  He has a history of a lung nodule in the right lower lobe. This was biopsies 05/2023, and FNA sample was non diagnostic, while brishing was negative for malignant cells. Plan was for a 3 month follow up to closely monitor for growth. Per the 09/06/2023 CT Chest, the radiologist read the scan as progressive  and suspicious for semi-invasive adenocarcinoma. It appears more solid to my eye when comparing side to side. A PET scan has not been done  since 2022. The  previous PET scan was not hypermetabolic. Plan will be to re-evaluate the nodule on PET to see if it has become hypermetabolic in the last 3 years.Pt and his wife are in agreement with this plan.  No symptoms such as weight loss, hemoptysis, or respiratory issues. He denies any recent illness that could have affected August  imaging results.     Test Results: CT Chest 09/06/2023>> personally reviewed.  LYMPH NODES: No mediastinal, hilar or axillary lymphadenopathy.   LUNGS AND PLEURA: Mild centrilobular and paraseptal emphysematous changes, upper lung predominant. 6 mm triangular lingular nodule, unchanged. 11 mm predominantly solid nodule in the posterior right lower lobe, previously subsolid with 5 mm solid component, suspicious for semi-invasive adenocarcinoma.  11 mm predominantly solid nodule in the posterior right lower lobe, progressive, suspicious for semi-invasive adenocarcinoma. Discussion at multidisciplinary tumor board is suggested.  CT Chest 04/29/2023>> Personally reviewed Part solid right lower lobe nodule measuring 1.1 by 1.2 by 0.6 cm on image 95 series 5, solid portion measures 0.5 by 0.4 by 0.4 cm. 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   Chronic scarring inferiorly in the right middle lobe. Mild scarring in the posterior basal segments of the lower lobes. Mild airway plugging in the posterior basal segment right lower lobe. Small calcified nodule centrally in the left lower lobe compatible with calcified granuloma.   Peribronchovascular nodule in  the left upper lobe measures by 3 mm on image 77 series 5 and is stable from 2020, and considered benign.   Upper Abdomen: No change or acute findings.   Musculoskeletal: Thoracic spondylosis.   IMPRESSION: 1. Part solid right lower lobe nodule measuring 1.1 by 1.2 by 0.6 cm,  solid portion measures 0.5 by 0.4 by 0.4 cm. 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 This was negative on the 2022 PET-CT when it was smaller. Consider tissue diagnosis or resection. 2. Coronary, aortic arch, and branch vessel atherosclerotic vascular disease. 3. Ascending aortic aneurysm 4.1 cm in diameter. Recommend annual imaging followup by CTA or MRA. This recommendation follows 2010 ACCF/AHA/AATS/ACR/ASA/SCA/SCAI/SIR/STS/SVM Guidelines for the Diagnosis and Management of Patients with Thoracic Aortic Disease. Circulation. 2010; 121: Z733-z630. Aortic aneurysm NOS (ICD10-I71.9) 4. Aortic Atherosclerosis (ICD10-I70.0) and Emphysema (ICD10-J43.9).   11/22/2020 PET scan The slow growing right lower lobe pulmonary nodule demonstrates no hypermetabolic activity. While reassuring, this does not exclude low-grade malignancy such as adenocarcinoma. 2. Focal hypermetabolic activity at the left prostatic apex. Recommend correlation with serum PSA levels and consideration of urology consultation. 3. Incidental findings as above, including hepatic steatosis, probable hepatic and left renal cysts, coronary andAortic Atherosclerosis (ICD10-I70.0) and Emphysema (ICD10-J43.9).    Latest Ref Rng & Units 05/13/2023   12:26 PM 02/15/2021   10:00 AM  CBC  WBC 4.0 - 10.5 K/uL 4.5  6.2   Hemoglobin 13.0 - 17.0 g/dL 85.1  85.3   Hematocrit 39.0 - 52.0 % 45.2  44.1   Platelets 150 - 400 K/uL 166  245        Latest Ref Rng & Units 05/13/2023   12:26 PM 02/15/2021   10:00 AM 08/09/2020    9:08 AM  BMP  Glucose 70 - 99 mg/dL 863  861  870   BUN 8 - 23 mg/dL 14  15  34   Creatinine 0.61 - 1.24 mg/dL 9.19  9.05  8.76   BUN/Creat Ratio 10 - 24   28   Sodium 135 - 145 mmol/L 139  138  141   Potassium 3.5 - 5.1 mmol/L 4.9  3.9  4.9   Chloride 98 - 111 mmol/L 110  108  106   CO2 22  - 32 mmol/L 19  20  21    Calcium 8.9 - 10.3 mg/dL 9.0  9.2  9.1     BNP No results found for: BNP  ProBNP No results found for: PROBNP  PFT    Component Value Date/Time   FEV1PRE 2.44 11/22/2020 1550   FEV1POST 2.60 11/22/2020 1550   FVCPRE 3.23 11/22/2020 1550   FVCPOST 3.45 11/22/2020 1550   TLC 5.43 11/22/2020 1550   DLCOUNC 19.39 11/22/2020 1550   PREFEV1FVCRT 76 11/22/2020 1550   PSTFEV1FVCRT 75 11/22/2020 1550    CT CHEST WO CONTRAST Result Date: 09/14/2023 EXAM: CT CHEST WITHOUT CONTRAST 09/06/2023 01:36:07 PM TECHNIQUE: CT of the chest was performed without the administration of intravenous contrast. Multiplanar reformatted images are provided for review. Automated exposure control, iterative reconstruction, and/or weight based adjustment of the mA/kV was utilized to reduce the radiation dose to as low as reasonably achievable. COMPARISON: 04/29/2023 CLINICAL HISTORY: Lung nodule, > 8mm. Follow up pulmonary nodule for several years; Bronchoscopy with biopsy 4/25, non cancerous; DM on meds, HTN on meds. FINDINGS: MEDIASTINUM: Heart and pericardium are unremarkable. The central airways are clear. Moderate 3-vessel coronary  atherosclerosis. LYMPH NODES: No mediastinal, hilar or axillary lymphadenopathy. LUNGS AND PLEURA: Mild centrilobular and paraseptal emphysematous changes, upper lung predominant. 6 mm triangular lingular nodule, unchanged. 11 mm predominantly solid nodule in the posterior right lower lobe, previously subsolid with 5 mm solid component, suspicious for semi-invasive adenocarcinoma. SOFT TISSUES/BONES: No acute abnormality of the bones or soft tissues. UPPER ABDOMEN: 15 mm benign right hepatic cyst. IMPRESSION: 1. 11 mm predominantly solid nodule in the posterior right lower lobe, progressive, suspicious for semi-invasive adenocarcinoma. Discussion at multidisciplinary tumor board is suggested. Electronically signed by: Pinkie Pebbles MD 09/14/2023 12:15 AM EDT RP  Workstation: [id number]     Past medical hx Past Medical History:  Diagnosis Date   Adenomatous polyps    Ascending aortic aneurysm (HCC)    CAD (coronary artery disease)    DM (diabetes mellitus), type 2 (HCC)    HTN (hypertension)    Hypercholesteremia    Tobacco dependence      Social History   Tobacco Use   Smoking status: Former    Current packs/day: 0.00    Average packs/day: 2.0 packs/day for 53.0 years (106.0 ttl pk-yrs)    Types: Cigarettes    Start date: 58    Quit date: 2019    Years since quitting: 6.6    Passive exposure: Never   Smokeless tobacco: Never   Tobacco comments:    Quit smoking 2019  Vaping Use   Vaping status: Never Used  Substance Use Topics   Alcohol use: Not Currently   Drug use: Never    Mr.Hubka reports that he quit smoking about 6 years ago. His smoking use included cigarettes. He started smoking about 59 years ago. He has a 106 pack-year smoking history. He has never been exposed to tobacco smoke. He has never used smokeless tobacco. He reports that he does not currently use alcohol. He reports that he does not use drugs.  Tobacco Cessation: Counseling given: Not Answered Tobacco comments: Quit smoking 2019 Former smoker  Past surgical hx, Family hx, Social hx all reviewed.  Current Outpatient Medications on File Prior to Visit  Medication Sig   atorvastatin (LIPITOR) 10 MG tablet Take 10 mg by mouth daily.   Blood Glucose Monitoring Suppl (CONTOUR NEXT ONE) DEVI 1 Device by Does not apply route daily.   glucose blood (ONETOUCH VERIO) test strip 1 each by Other route daily. And lancets 1/day   irbesartan (AVAPRO) 75 MG tablet Take 75 mg by mouth daily.   JARDIANCE  25 MG TABS tablet Take 1 tablet (25 mg total) by mouth daily.   metFORMIN  (GLUCOPHAGE -XR) 500 MG 24 hr tablet Take 2 tablets (1,000 mg total) by mouth daily.   omeprazole (PRILOSEC) 40 MG capsule Take 40 mg by mouth every morning.   pioglitazone  (ACTOS ) 45 MG tablet  Take 1 tablet (45 mg total) by mouth daily.   No current facility-administered medications on file prior to visit.     Allergies  Allergen Reactions   Lisinopril Other (See Comments)    Unsure of reaction    Penicillins Hives   Prednisone  Other (See Comments)    Unsure of reaction    Aspirin Hives    Review Of Systems:  Constitutional:   No  weight loss, night sweats,  Fevers, chills, fatigue, or  lassitude.  HEENT:   No headaches,  Difficulty swallowing,  Tooth/dental problems, or  Sore throat,                No sneezing, itching,  ear ache, nasal congestion, post nasal drip,   CV:  No chest pain,  Orthopnea, PND, swelling in lower extremities, anasarca, dizziness, palpitations, syncope.   GI  No heartburn, indigestion, abdominal pain, nausea, vomiting, diarrhea, change in bowel habits, loss of appetite, bloody stools.   Resp: No shortness of breath with exertion or at rest.  No excess mucus, no productive cough,  No non-productive cough,  No coughing up of blood.  No change in color of mucus.  No wheezing.  No chest wall deformity  Skin: no rash or lesions.  GU: no dysuria, change in color of urine, no urgency or frequency.  No flank pain, no hematuria   MS:  No joint pain or swelling.  No decreased range of motion.  No back pain.  Psych:  No change in mood or affect. No depression or anxiety.  No memory loss.   Vital Signs BP 109/75   Pulse 88   Temp 97.8 F (36.6 C) (Temporal)   Ht 5' 9 (1.753 m)   Wt 194 lb (88 kg)   SpO2 92%   BMI 28.65 kg/m    Physical Exam:  General- No distress,  A&Ox3, pleasant ENT: No sinus tenderness, TM clear, pale nasal mucosa, no oral exudate,no post nasal drip, no LAN Cardiac: S1, S2, regular rate and rhythm, no murmur Chest: No wheeze/ rales/ dullness; no accessory muscle use, no nasal flaring, no sternal retractions Abd.: Soft Non-tender, ND, BS +, Body mass index is 28.65 kg/m.  Ext: No clubbing cyanosis, edema, no obvious  deformities Neuro:  normal strength, MAE x 4, A&O x 3, appropriate Skin: No rashes, warm and dry,  Psych: normal mood and behavior    Assessment & Plan Former smoker  Progressive right lower lobe lung nodule More solid in nature,  suspicious for semi-invasive adenocarcinoma.  Previous biopsy and bronchoscopy 05/2023 was non diagnostic.  Plan Your Ct chest shows some progression of the right lower lobe lung nodule.  We will do a PET scan to closer evaluate this. You will get a call to get this scheduled.  You will follow up with me or Dr. Shelah 1-2 weeks after the scan to review the results. Call if you need us  sooner. Please contact office for sooner follow up if symptoms do not improve or worsen or seek emergency care     I spent 20 minutes dedicated to the care of this patient on the date of this encounter to include pre-visit review of records, face-to-face time with the patient discussing conditions above, post visit ordering of testing, clinical documentation with the electronic health record, making appropriate referrals as documented, and communicating necessary information to the patient's healthcare team.      Lauraine JULIANNA Lites, NP 10/01/2023  8:59 AM

## 2023-10-01 NOTE — Patient Instructions (Addendum)
 It is good to see you today. Your Ct chest shows some progression of the right lower lobe lung nodule.  We will do a PET scan to closer evaluate this. You will get a call to get this scheduled.  You will follow up with me or Dr. Shelah 1-2 weeks after the scan to review the results. Call if you need us  sooner. Please contact office for sooner follow up if symptoms do not improve or worsen or seek emergency care

## 2023-10-04 NOTE — Progress Notes (Signed)
 I agree with the plans as outlined above.   Lamar Chris, MD, PhD 10/04/2023, 9:38 AM [facility] [phone] or if no answer before 7:00PM call [phone] For any issues after 7:00PM please call eLink [phone]

## 2023-10-10 DIAGNOSIS — I10 Essential (primary) hypertension: Secondary | ICD-10-CM | POA: Diagnosis not present

## 2023-10-10 DIAGNOSIS — Z1331 Encounter for screening for depression: Secondary | ICD-10-CM | POA: Diagnosis not present

## 2023-10-10 DIAGNOSIS — E7800 Pure hypercholesterolemia, unspecified: Secondary | ICD-10-CM | POA: Diagnosis not present

## 2023-10-10 DIAGNOSIS — Z79899 Other long term (current) drug therapy: Secondary | ICD-10-CM | POA: Diagnosis not present

## 2023-10-10 DIAGNOSIS — E1165 Type 2 diabetes mellitus with hyperglycemia: Secondary | ICD-10-CM | POA: Diagnosis not present

## 2023-10-10 DIAGNOSIS — Z0000 Encounter for general adult medical examination without abnormal findings: Secondary | ICD-10-CM | POA: Diagnosis not present

## 2023-10-10 DIAGNOSIS — Z23 Encounter for immunization: Secondary | ICD-10-CM | POA: Diagnosis not present

## 2023-10-11 ENCOUNTER — Encounter (HOSPITAL_COMMUNITY)
Admission: RE | Admit: 2023-10-11 | Discharge: 2023-10-11 | Disposition: A | Discharge status: Home / Self Care | Source: Ambulatory Visit | Attending: Acute Care | Admitting: Acute Care

## 2023-10-11 DIAGNOSIS — R911 Solitary pulmonary nodule: Secondary | ICD-10-CM | POA: Insufficient documentation

## 2023-10-11 LAB — GLUCOSE, CAPILLARY: Glucose-Capillary: 146 mg/dL — ABNORMAL HIGH (ref 70–99)

## 2023-10-11 MED ORDER — FLUDEOXYGLUCOSE F - 18 (FDG) INJECTION
9.6400 | Freq: Once | INTRAVENOUS | Status: AC
  Administered 2023-10-11: 9.64 via INTRAVENOUS

## 2023-10-16 DIAGNOSIS — G8929 Other chronic pain: Secondary | ICD-10-CM | POA: Diagnosis not present

## 2023-10-24 ENCOUNTER — Encounter: Payer: Self-pay | Admitting: Acute Care

## 2023-11-08 ENCOUNTER — Ambulatory Visit: Admitting: Acute Care

## 2023-11-12 ENCOUNTER — Ambulatory Visit: Admitting: Acute Care

## 2023-11-12 ENCOUNTER — Encounter: Payer: Self-pay | Admitting: Acute Care

## 2023-11-12 VITALS — BP 116/74 | HR 73 | Temp 98.2°F | Ht 69.0 in | Wt 199.6 lb

## 2023-11-12 DIAGNOSIS — R9389 Abnormal findings on diagnostic imaging of other specified body structures: Secondary | ICD-10-CM

## 2023-11-12 DIAGNOSIS — I7143 Infrarenal abdominal aortic aneurysm, without rupture: Secondary | ICD-10-CM

## 2023-11-12 DIAGNOSIS — R911 Solitary pulmonary nodule: Secondary | ICD-10-CM | POA: Diagnosis not present

## 2023-11-12 DIAGNOSIS — Z87891 Personal history of nicotine dependence: Secondary | ICD-10-CM | POA: Diagnosis not present

## 2023-11-12 NOTE — Addendum Note (Signed)
 Addended by: Herron Fero F on: 11/12/2023 05:42 PM   Modules accepted: Orders

## 2023-11-12 NOTE — Patient Instructions (Signed)
 It is good to see you today. Your PET scan did not show hypermetabolism in the nodule we have been monitoring. I have reached out to Dr. Shelah to see what he suggests regarding follow up, continued monitoring vs re biopsy vs referral to thoracic surgery.  I will let you know what he suggests. Call for any blood in your sputum, or unintentional weight loss, so we can get you seen. Please contact office for sooner follow up if symptoms do not improve or worsen or seek emergency care

## 2023-11-12 NOTE — Progress Notes (Addendum)
 History of Present Illness Tristan Sims is a 78 y.o. male former smoker ( Quit 2019 with a referred 11/2020 for lung nodule by Dr. Regino. with a 106 pack year smoking history. Initially followed by Dr. Brenna. Will now be followed by Dr. Shelah.  Synopsis  78 year old gentleman, past medical history of diabetes, hypertension, hypercholesterolemia and tobacco use.  He quit smoking in 2019.  Had a 50+ pack year history of smoking.  He is originally from [place].  He is still working in Insurance claims handler.  He had a lung cancer screening CT that showed a slowly enlarging right lower lobe pulmonary nodule.  This went from a subsolid state to a more solid state concerning for a primary bronchogenic carcinoma.  Patient has had a PET scan completed in 2022 which showed the right lower lobe nodule which demonstrated no hypermetabolic activity, with no other distant metastasis.  Pulmonary function test revealed normal spirometry, no significant obstruction Ratio of 75, FEV1 of 2.6 L, TLC 77%, DLCO 76% predicted.  Patient from a respiratory standpoint has no significant complaints and is able to complete all of his activities of daily living.  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 Radiology suggested tissue diagnosis vs resection. Pt. Opted for PET scan and then base plan on results. He is here today to review results of PET scan.   11/12/2023 Discussed the use of AI scribe software for clinical note transcription with the patient, who gave verbal consent to proceed.  History of Present Illness Tristan Sims is a 78 year old male who presents for follow-up of a right lower lobe pulmonary nodule.  He has a right lower lobe pulmonary nodule that has shown gradual growth over time. A CT scan in  August 2025 showed growth of the nodule, and a recent PET scan indicated a 9 mm nodule with no uptake, but with a gradual increase in size and increase in its solid appearance. Previous biopsy attempts, including a fine needle aspiration and brushing in April 2025, were nondiagnostic with no malignant cells identified.  He has an infrarenal abdominal aortic aneurysm and a history of high blood pressure.  No weight loss or hemoptysis. He mentioned gaining a couple of pounds recently. I have reached out to Dr. Shelah at patient's request to see if he feels repeat Biopsy may be indicated as the previous biopsy was non diagnostic, and the nodule continues to grow, despite being non hypermetabolic on PET. Other options moving forward include continued surveillance or referral to thoracic surgery for consideration of removal of the nodule. We could also consider IR biopsy as the nodule is very peripheral, and in the lower lobe.  I will contact family once Dr. Shelah has responded to my secure chat.       Test Results: PET scan 10/11/2023 9 mm right lower lobe pulmonary nodule shows absence of FDG uptake, but gradual increase in size of this nodule compared to previous studies remains suspicious for low-grade adenocarcinoma.   No evidence of metastatic disease.   Mild emphysema.   3.2 cm infrarenal abdominal aortic aneurysm. Recommend follow-up ultrasound every 3 years    Latest Ref Rng & Units 05/13/2023   12:26 PM 02/15/2021   10:00 AM  CBC  WBC 4.0 - 10.5 K/uL 4.5  6.2  Hemoglobin 13.0 - 17.0 g/dL 85.1  85.3   Hematocrit 39.0 - 52.0 % 45.2  44.1   Platelets 150 - 400 K/uL 166  245        Latest Ref Rng & Units 05/13/2023   12:26 PM 02/15/2021   10:00 AM 08/09/2020    9:08 AM  BMP  Glucose 70 - 99 mg/dL 863  861  870   BUN 8 - 23 mg/dL 14  15  34   Creatinine 0.61 - 1.24 mg/dL 9.19  9.05  8.76   BUN/Creat Ratio 10 - 24   28   Sodium 135 - 145 mmol/L 139  138  141   Potassium 3.5 - 5.1  mmol/L 4.9  3.9  4.9   Chloride 98 - 111 mmol/L 110  108  106   CO2 22 - 32 mmol/L 19  20  21    Calcium 8.9 - 10.3 mg/dL 9.0  9.2  9.1     BNP No results found for: BNP  ProBNP No results found for: PROBNP  PFT    Component Value Date/Time   FEV1PRE 2.44 11/22/2020 1550   FEV1POST 2.60 11/22/2020 1550   FVCPRE 3.23 11/22/2020 1550   FVCPOST 3.45 11/22/2020 1550   TLC 5.43 11/22/2020 1550   DLCOUNC 19.39 11/22/2020 1550   PREFEV1FVCRT 76 11/22/2020 1550   PSTFEV1FVCRT 75 11/22/2020 1550    No results found.   Past medical hx Past Medical History:  Diagnosis Date   Adenomatous polyps    Ascending aortic aneurysm    CAD (coronary artery disease)    DM (diabetes mellitus), type 2 (HCC)    HTN (hypertension)    Hypercholesteremia    Tobacco dependence      Social History   Tobacco Use   Smoking status: Former    Current packs/day: 0.00    Average packs/day: 2.0 packs/day for 53.0 years (106.0 ttl pk-yrs)    Types: Cigarettes    Start date: 35    Quit date: 2019    Years since quitting: 6.7    Passive exposure: Never   Smokeless tobacco: Never   Tobacco comments:    Quit smoking 2019  Vaping Use   Vaping status: Never Used  Substance Use Topics   Alcohol use: Not Currently   Drug use: Never    Mr.Mayfield reports that he quit smoking about 6 years ago. His smoking use included cigarettes. He started smoking about 59 years ago. He has a 106 pack-year smoking history. He has never been exposed to tobacco smoke. He has never used smokeless tobacco. He reports that he does not currently use alcohol. He reports that he does not use drugs.  Tobacco Cessation: Counseling given: Not Answered Tobacco comments: Quit smoking 2019 Former smoker with a 106 pack year smoking history , quit 2019.  Past surgical hx, Family hx, Social hx all reviewed.  Current Outpatient Medications on File Prior to Visit  Medication Sig   atorvastatin (LIPITOR) 10 MG tablet Take 10  mg by mouth daily.   Blood Glucose Monitoring Suppl (CONTOUR NEXT ONE) DEVI 1 Device by Does not apply route daily.   glucose blood (ONETOUCH VERIO) test strip 1 each by Other route daily. And lancets 1/day   irbesartan (AVAPRO) 75 MG tablet Take 75 mg by mouth daily.   JARDIANCE  25 MG TABS tablet Take 1 tablet (25 mg total) by mouth daily.   metFORMIN  (GLUCOPHAGE -XR) 500 MG 24 hr tablet Take 2 tablets (1,000 mg total)  by mouth daily.   omeprazole (PRILOSEC) 40 MG capsule Take 40 mg by mouth every morning.   pioglitazone  (ACTOS ) 45 MG tablet Take 1 tablet (45 mg total) by mouth daily.   No current facility-administered medications on file prior to visit.     Allergies  Allergen Reactions   Lisinopril Other (See Comments)    Unsure of reaction    Penicillins Hives   Prednisone  Other (See Comments)    Unsure of reaction    Aspirin Hives    Review Of Systems:  Constitutional:   No  weight loss, night sweats,  Fevers, chills, fatigue, or  lassitude.  HEENT:   No headaches,  Difficulty swallowing,  Tooth/dental problems, or  Sore throat,                No sneezing, itching, ear ache, nasal congestion, post nasal drip,   CV:  No chest pain,  Orthopnea, PND, swelling in lower extremities, anasarca, dizziness, palpitations, syncope.   GI  No heartburn, indigestion, abdominal pain, nausea, vomiting, diarrhea, change in bowel habits, loss of appetite, bloody stools.   Resp: No shortness of breath with exertion or at rest.  No excess mucus, no productive cough,  No non-productive cough,  No coughing up of blood.  No change in color of mucus.  No wheezing.  No chest wall deformity  Skin: no rash or lesions.  GU: no dysuria, change in color of urine, no urgency or frequency.  No flank pain, no hematuria   MS:  No joint pain or swelling.  No decreased range of motion.  No back pain.  Psych:  No change in mood or affect. No depression or anxiety.  No memory loss.   Vital Signs BP 116/74    Pulse 73   Temp 98.2 F (36.8 C) (Oral)   Ht 5' 9 (1.753 m)   Wt 199 lb 9.6 oz (90.5 kg)   SpO2 96%   BMI 29.48 kg/m    Physical Exam:  General- No distress,  A&Ox3, pleasant ENT: No sinus tenderness, TM clear, pale nasal mucosa, no oral exudate,no post nasal drip, no LAN Cardiac: S1, S2, regular rate and rhythm, no murmur Chest: No wheeze/ rales/ dullness; no accessory muscle use, no nasal flaring, no sternal retractions, slightly diminished per bases Abd.: Soft Non-tender, ND, BS +, Body mass index is 29.48 kg/m.  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 Previous biopsy negative, FNA nondiagnostic.  Increased solidity raises malignancy concern. - Contact Dr. Shelah regarding next best steps moving forward; rebiopsy or monitoring recommendations. - Discussed rebiopsy or six-month scan monitoring.  - Explained rebiopsy risks and surgical removal risks if benign. - Monitor for weight loss or hemoptysis, report immediately.  Infrarenal abdominal aortic aneurysm Aneurysm likely due to hypertension, requires monitoring with imaging. - Refer to vascular surgery for aneurysm monitoring once he is not having frequent CT Chests. - Continue monitoring with  CT scans vs tissue collection vs referral to IR/ thoracic surgery.  Addendum 11/12/2023 at 17:42 I spoke with Dr. Shelah regarding follow up. He recommends that we follow it and repeat the bronch if  it changes further . I called patient and his wife to let them know. I have placed an order for a 3 month follow up scan, Due 02/2024. Both patient and his wife verbalized agreement with  the plan.   I spent 25 minutes dedicated to the care of this patient on the date of this encounter to include pre-visit review of records, face-to-face time with the patient discussing conditions above, post visit ordering of testing, clinical documentation with the electronic health record, making appropriate referrals as documented, and communicating necessary information to the patient's healthcare team.     Lauraine JULIANNA Lites, NP 11/12/2023  1:01 PM

## 2023-12-30 ENCOUNTER — Ambulatory Visit: Admitting: Student in an Organized Health Care Education/Training Program

## 2023-12-30 DIAGNOSIS — I2510 Atherosclerotic heart disease of native coronary artery without angina pectoris: Secondary | ICD-10-CM | POA: Diagnosis not present

## 2023-12-30 DIAGNOSIS — I700 Atherosclerosis of aorta: Secondary | ICD-10-CM | POA: Diagnosis not present

## 2023-12-30 DIAGNOSIS — E1165 Type 2 diabetes mellitus with hyperglycemia: Secondary | ICD-10-CM | POA: Diagnosis not present

## 2023-12-30 DIAGNOSIS — E559 Vitamin D deficiency, unspecified: Secondary | ICD-10-CM | POA: Diagnosis not present

## 2023-12-30 DIAGNOSIS — E782 Mixed hyperlipidemia: Secondary | ICD-10-CM | POA: Diagnosis not present

## 2023-12-31 NOTE — Progress Notes (Signed)
  Cardiology Office Note:   Date:  12/31/2023  ID:  Tristan Sims, DOB 12-05-1945, MRN [id number] PCP: Regino Slater, MD  [facility] Providers Cardiologist:  Aleene Passe, MD (Inactive) { Chief Complaint: No chief complaint on file.     History of Present Illness:   Tristan Sims is a 78 y.o. [place] male with a PMH of HTN, HLD, DM2, TAA, prior tobacco use who presents for follow up ***.   Past Medical History:  Diagnosis Date   Adenomatous polyps    Ascending aortic aneurysm    CAD (coronary artery disease)    DM (diabetes mellitus), type 2 (HCC)    HTN (hypertension)    Hypercholesteremia    Tobacco dependence      Studies Reviewed:    EKG: ***       Cardiac Studies & Procedures   ______________________________________________________________________________________________   STRESS TESTS  MYOCARDIAL PERFUSION IMAGING 01/31/2021  Interpretation Summary   The study is normal. The study is low risk.   No ST deviation was noted.   LV perfusion is normal. There is no evidence of ischemia. There is no evidence of infarction.   Left ventricular function is normal. Nuclear stress EF: 49 %. The left ventricular ejection fraction is mildly decreased (45-54%). End diastolic cavity size is normal.   Prior study not available for comparison.  Reduced apical counts on rest/stress imaging with normal wall motion consistent with apical thinning artifact. Normal study without evidence of ischemia or infarction. LVEF is normal for this modality, 49%. This is a low-risk study.            ______________________________________________________________________________________________      Risk Assessment/Calculations:   {Does this patient have ATRIAL FIBRILLATION?:[phone]} No BP recorded.  {Refresh Note OR Click here to enter BP  :1}***        Physical Exam:     VS:  There were no vitals taken for this visit. ***    Wt Readings from Last 3 Encounters:   11/12/23 199 lb 9.6 oz (90.5 kg)  10/01/23 194 lb (88 kg)  05/22/23 195 lb (88.5 kg)     GEN: Well nourished, well developed, in no acute distress NECK: No JVD; No carotid bruits CARDIAC: ***RRR, no murmurs, rubs, gallops RESPIRATORY:  Clear to auscultation without rales, wheezing or rhonchi  ABDOMEN: Soft, non-tender, non-distended, normal bowel sounds EXTREMITIES:  Warm and well perfused, no edema; No deformity, 2+ radial pulses PSYCH: Normal mood and affect   Assessment & Plan Atherosclerosis of native coronary artery of native heart without angina pectoris Start ASA 81 mg daily      {Are you ordering a CV Procedure (e.g. stress test, cath, DCCV, TEE, etc)?   Press F2        :[id number]}   This note was written with the assistance of a dictation microphone or AI dictation software. Please excuse any typos or grammatical errors.   Signed, Georganna Archer, MD 12/31/2023 1:09 PM     [facility]

## 2023-12-31 NOTE — Assessment & Plan Note (Signed)
Start ASA 81mg daily.  

## 2024-01-01 ENCOUNTER — Encounter: Payer: Self-pay | Admitting: Student in an Organized Health Care Education/Training Program

## 2024-01-01 ENCOUNTER — Ambulatory Visit
Attending: Student in an Organized Health Care Education/Training Program | Admitting: Student in an Organized Health Care Education/Training Program

## 2024-01-01 VITALS — BP 122/64 | HR 73 | Resp 94 | Ht 69.0 in | Wt 200.8 lb

## 2024-01-01 DIAGNOSIS — I10 Essential (primary) hypertension: Secondary | ICD-10-CM

## 2024-01-01 DIAGNOSIS — I2510 Atherosclerotic heart disease of native coronary artery without angina pectoris: Secondary | ICD-10-CM

## 2024-01-01 DIAGNOSIS — Z79899 Other long term (current) drug therapy: Secondary | ICD-10-CM | POA: Diagnosis not present

## 2024-01-01 DIAGNOSIS — E782 Mixed hyperlipidemia: Secondary | ICD-10-CM | POA: Insufficient documentation

## 2024-01-01 DIAGNOSIS — I7121 Aneurysm of the ascending aorta, without rupture: Secondary | ICD-10-CM

## 2024-01-01 LAB — LIPID PANEL
Chol/HDL Ratio: 3.6 ratio (ref 0.0–5.0)
Cholesterol, Total: 119 mg/dL (ref 100–199)
HDL: 33 mg/dL — ABNORMAL LOW (ref >39)
LDL Chol Calc (NIH): 51 mg/dL (ref 0–99)
Triglycerides: 218 mg/dL — ABNORMAL HIGH (ref 0–149)
VLDL Cholesterol Cal: 35 mg/dL (ref 5–40)

## 2024-01-01 LAB — BASIC METABOLIC PANEL WITH GFR
BUN/Creatinine Ratio: 15 (ref 10–24)
BUN: 15 mg/dL (ref 8–27)
CO2: 17 mmol/L — ABNORMAL LOW (ref 20–29)
Calcium: 9.4 mg/dL (ref 8.6–10.2)
Chloride: 105 mmol/L (ref 96–106)
Creatinine, Ser: 0.97 mg/dL (ref 0.76–1.27)
Glucose: 153 mg/dL — ABNORMAL HIGH (ref 70–99)
Potassium: 4.6 mmol/L (ref 3.5–5.2)
Sodium: 141 mmol/L (ref 134–144)
eGFR: 80 mL/min/1.73 (ref >59)

## 2024-01-01 NOTE — Patient Instructions (Signed)
 Medication Instructions:  Your physician recommends that you continue on your current medications as directed. Please refer to the Current Medication list given to you today.  *If you need a refill on your cardiac medications before your next appointment, please call your pharmacy*  Lab Work: Fasting lipid panel, BMET today  Testing/Procedures: NONE ordered at this time of appointment    Follow-Up: At [facility], you and your health needs are our priority.  As part of our continuing mission to provide you with exceptional heart care, our providers are all part of one team.  This team includes your primary Cardiologist (physician) and Advanced Practice Providers or APPs (Physician Assistants and Nurse Practitioners) who all work together to provide you with the care you need, when you need it.  Your next appointment:   1 year(s)  Provider:   Dr. Floretta  We recommend signing up for the patient portal called MyChart.  Sign up information is provided on this After Visit Summary.  MyChart is used to connect with patients for Virtual Visits (Telemedicine).  Patients are able to view lab/test results, encounter notes, upcoming appointments, etc.  Non-urgent messages can be sent to your provider as well.   To learn more about what you can do with MyChart, go to [email or web address].

## 2024-01-01 NOTE — Assessment & Plan Note (Signed)
-   Blood pressure is at goal. Continue irbesartan 75 mg daily

## 2024-01-01 NOTE — Assessment & Plan Note (Signed)
-   Due for lipid panel check. Lipid panel Continue atorvastatin 10 mg daily

## 2024-01-03 ENCOUNTER — Ambulatory Visit: Payer: Self-pay | Admitting: Student in an Organized Health Care Education/Training Program

## 2024-02-12 ENCOUNTER — Ambulatory Visit
Admission: RE | Admit: 2024-02-12 | Discharge: 2024-02-12 | Disposition: A | Discharge status: Home / Self Care | Source: Ambulatory Visit | Attending: Acute Care | Admitting: Acute Care

## 2024-02-12 DIAGNOSIS — R911 Solitary pulmonary nodule: Secondary | ICD-10-CM

## 2024-02-19 ENCOUNTER — Encounter: Payer: Self-pay | Admitting: Acute Care

## 2024-02-19 ENCOUNTER — Telehealth: Payer: Self-pay | Admitting: Acute Care

## 2024-02-19 ENCOUNTER — Ambulatory Visit: Admitting: Acute Care

## 2024-02-19 ENCOUNTER — Telehealth: Payer: Self-pay

## 2024-02-19 VITALS — BP 116/68 | HR 64 | Ht 69.0 in | Wt 199.0 lb

## 2024-02-19 DIAGNOSIS — R911 Solitary pulmonary nodule: Secondary | ICD-10-CM

## 2024-02-19 DIAGNOSIS — R9389 Abnormal findings on diagnostic imaging of other specified body structures: Secondary | ICD-10-CM

## 2024-02-19 DIAGNOSIS — Z87891 Personal history of nicotine dependence: Secondary | ICD-10-CM

## 2024-02-19 NOTE — Progress Notes (Signed)
 "  History of Present Illness Buddy Loeffelholz Muff is a 79 y.o. male former smoker ( Quit 2019 with a referred 11/2020 for lung nodule by Dr. Regino. with a 106 pack year smoking history. Initially followed by Dr. Brenna. Will now be followed by Dr. Shelah.   Synopsis  79 year old gentleman, past medical history of diabetes, hypertension, hypercholesterolemia and tobacco use.  He quit smoking in 2019.  Had a 50+ pack year history of smoking.  He is originally from [place].  He is still working in insurance claims handler.  He had a lung cancer screening CT that showed a slowly enlarging right lower lobe pulmonary nodule.  This went from a subsolid state to a more solid state concerning for a primary bronchogenic carcinoma.  Patient has had a PET scan completed in 2022 which showed the right lower lobe nodule which demonstrated no hypermetabolic activity, with no other distant metastasis.  Pulmonary function test revealed normal spirometry, no significant obstruction Ratio of 75, FEV1 of 2.6 L, TLC 77%, DLCO 76% predicted.  Patient from a respiratory standpoint has no significant complaints and is able to complete all of his activities of daily living.  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 Radiology suggested tissue diagnosis vs resection. Pt. Opted for PET scan and then base plan on results. PET scan 10/2023  showed absence of FDG uptake, but gradual increase in size of this nodule compared to previous studies remains suspicious for low-grade adenocarcinoma.Plan was for a 3 month follow up scan as continued surveillance of the nodule. Pt. Presents today to review follow up CT Chest done 02/12/2024.   02/19/2024 Discussed the use of AI scribe software for clinical note transcription with the patient,  who gave verbal consent to proceed.  History of Present Illness Harvin Konicek Jayson is a 79 year old male who presents for follow up imaging for evaluation of a lung nodule we have been following for slow growth despite no PET avidity. We have reviewed his CT Chest.  The lung nodule of concern has not changed in size, but the solid component has increased from five millimeters to nine millimeters per the radiology report. After personal review, I feel the nodule is very similar to previous imaging. Previous bronchoscopy was inconclusive with no malignant cells and nondiagnostic material.  I had Dr. Shelah review the imaging. He agreed with me that a 3 month CT Chest was an appropriate follow up.He felt it  looks pretty similar  to previous imaging, the solid component is about the same, the ground glass halo looks a tiny bit larger but they measured it at the same.  Again, he felt it would be reasonable to follow He did not feel we needed to repeat bronch at this interval.   Pt. has maintained a weight of 199 pounds since his last visit in October. No weight loss. No hemoptysis. Patient and his wife are in agreement with this plan.      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 Stable 6 mm solid left upper lobe pulmonary nodule. 3. Moderate emphysema with diffuse bronchial wall thickening.  Latest Ref Rng & Units 05/13/2023   12:26 PM 02/15/2021   10:00 AM  CBC  WBC 4.0 - 10.5 K/uL 4.5  6.2   Hemoglobin 13.0 - 17.0 g/dL 85.1  85.3   Hematocrit 39.0 - 52.0 % 45.2  44.1   Platelets 150 - 400 K/uL 166  245        Latest Ref Rng & Units 01/01/2024    8:56 AM 05/13/2023   12:26 PM 02/15/2021   10:00 AM  BMP  Glucose 70 - 99 mg/dL 846  863  861   BUN 8 - 27 mg/dL  15  14  15    Creatinine 0.76 - 1.27 mg/dL 9.02  9.19  9.05   BUN/Creat Ratio 10 - 24 15     Sodium 134 - 144 mmol/L 141  139  138   Potassium 3.5 - 5.2 mmol/L 4.6  4.9  3.9   Chloride 96 - 106 mmol/L 105  110  108   CO2 20 - 29 mmol/L 17  19  20    Calcium 8.6 - 10.2 mg/dL 9.4  9.0  9.2     BNP No results found for: BNP  ProBNP No results found for: PROBNP  PFT    Component Value Date/Time   FEV1PRE 2.44 11/22/2020 1550   FEV1POST 2.60 11/22/2020 1550   FVCPRE 3.23 11/22/2020 1550   FVCPOST 3.45 11/22/2020 1550   TLC 5.43 11/22/2020 1550   DLCOUNC 19.39 11/22/2020 1550   PREFEV1FVCRT 76 11/22/2020 1550   PSTFEV1FVCRT 75 11/22/2020 1550    CT CHEST WO CONTRAST Result Date: 02/19/2024 EXAM: CT CHEST WITHOUT CONTRAST 02/12/2024 08:31:51 AM TECHNIQUE: CT of the chest was performed without the administration of intravenous contrast. Multiplanar reformatted images are provided for review. Automated exposure control, iterative reconstruction, and/or weight based adjustment of the mA/kV was utilized to reduce the radiation dose to as low as reasonably achievable. COMPARISON: 09/06/2023. CLINICAL HISTORY: Lung nodule, > 8mm. Lung nodule, > 8mm. FINDINGS: MEDIASTINUM: Heart: Normal heart size. 3-vessel coronary artery calcifications. No pericardial effusion. Vessels: Aortic atherosclerosis. The central airways are clear. LYMPH NODES: No mediastinal, hilar or axillary lymphadenopathy. LUNGS AND PLEURA: Moderate emphysema with diffuse bronchial wall thickening. Part-solid nodule within the right lower lobe with nearby fiducial marker measures 1.1 cm with the solid component measures 0.9 cm and is unchanged in size from the previous exam, image 100/4. Solid nodule in the left upper lobe is stable measuring 6 mm, image 73/4. No new lung nodules. No focal consolidation or pulmonary edema. No pleural effusion or pneumothorax. SOFT TISSUES/BONES: No acute abnormality of the bones or soft tissues.  UPPER ABDOMEN: Adrenals: Normal size and morphology bilaterally. No nodule, thickening, or hemorrhage. No periadrenal stranding. Liver: Unchanged cyst within the right lobe of the liver measuring 1.5 cm, image 145/2. Other: Limited images of the upper abdomen demonstrates no acute abnormality. IMPRESSION: 1. 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 Stable 6 mm solid left upper lobe pulmonary nodule. 3. Moderate emphysema with diffuse bronchial wall thickening. Electronically signed by: Waddell Calk MD 02/19/2024 08:27 AM EST RP Workstation: [id number]     Past medical hx Past Medical History:  Diagnosis Date   Adenomatous polyps    Ascending aortic aneurysm    CAD (coronary artery disease)    DM (diabetes mellitus), type 2 (HCC)    HTN (hypertension)  Hypercholesteremia    Tobacco dependence      Social History[1]  Mr.Poma reports that he quit smoking about 7 years ago. His smoking use included cigarettes. He started smoking about 60 years ago. He has a 106 pack-year smoking history. He has never been exposed to tobacco smoke. He has never used smokeless tobacco. He reports that he does not currently use alcohol. He reports that he does not use drugs.  Tobacco Cessation: Counseling given: Not Answered Tobacco comments: Quit smoking 2019 Former smoker   Past surgical hx, Family hx, Social hx all reviewed.  Current Outpatient Medications on File Prior to Visit  Medication Sig   atorvastatin (LIPITOR) 10 MG tablet Take 10 mg by mouth daily.   Blood Glucose Monitoring Suppl (CONTOUR NEXT ONE) DEVI 1 Device by Does not apply route daily.   glucose blood (ONETOUCH VERIO) test strip 1 each by Other route daily. And lancets 1/day   irbesartan (AVAPRO) 75 MG tablet Take 75 mg by mouth daily.   JARDIANCE  25 MG TABS  tablet Take 1 tablet (25 mg total) by mouth daily.   metFORMIN  (GLUCOPHAGE -XR) 500 MG 24 hr tablet Take 2 tablets (1,000 mg total) by mouth daily. (Patient taking differently: Take 500 mg by mouth 2 (two) times daily with a meal.)   omeprazole (PRILOSEC) 40 MG capsule Take 40 mg by mouth every morning.   pioglitazone  (ACTOS ) 45 MG tablet Take 1 tablet (45 mg total) by mouth daily.   No current facility-administered medications on file prior to visit.     Allergies[2]  Review Of Systems:  Constitutional:   No  weight loss, night sweats,  Fevers, chills, fatigue, or  lassitude.  HEENT:   No headaches,  Difficulty swallowing,  Tooth/dental problems, or  Sore throat,                No sneezing, itching, ear ache, nasal congestion, post nasal drip,   CV:  No chest pain,  Orthopnea, PND, swelling in lower extremities, anasarca, dizziness, palpitations, syncope.   GI  No heartburn, indigestion, abdominal pain, nausea, vomiting, diarrhea, change in bowel habits, loss of appetite, bloody stools.   Resp: No shortness of breath with exertion or at rest.  No excess mucus, no productive cough,  No non-productive cough,  No coughing up of blood.  No change in color of mucus.  No wheezing.  No chest wall deformity  Skin: no rash or lesions.  GU: no dysuria, change in color of urine, no urgency or frequency.  No flank pain, no hematuria   MS:  No joint pain or swelling.  No decreased range of motion.  No back pain.  Psych:  No change in mood or affect. No depression or anxiety.  No memory loss.   Vital Signs BP 116/68   Pulse 64   Ht 5' 9 (1.753 m) Comment: per pt  Wt 199 lb (90.3 kg)   SpO2 95%   BMI 29.39 kg/m    Physical Exam: Physical Exam MEASUREMENTS: Weight- 199. GENERAL: No distress, alert and oriented times 3. EARS NOSE THROAT: No sinus tenderness, tympanic membranes clear, pale nasal mucosa, no oral exudate, no post nasal drip, no lymphadenopathy. CHEST: No wheeze, rales,  dullness, no accessory muscle use, no nasal flaring, no sternal retractions. CARDIAC: S1, S2, regular rate and rhythm, no murmur. ABDOMINAL: Soft, non tender. ND, BS present, EXTREMITIES: No clubbing, cyanosis, edema. No obvious deformities NEUROLOGICAL: Normal strength. Alert and oriented x 3, MAE x 4 SKIN:  No rashes, warm and dry. No obvious skin lesions PSYCHIATRIC: Normal mood and behavior.   Assessment/Plan  Assessment & Plan Pulmonary nodule, concern for malignancy Pulmonary nodule increased from 5 mm to 9 mm, raising malignancy concern, but looks very similar to previous imaging.  Previous bronchoscopy inconclusive. Differential includes primary lung cancer. - Consulted Dr. Shelah to review CT scans for nodule solidity. - No significant change, plan follow-up CT in three months 05/2024.  - Call to be seen sooner for any blood in sputum when you cough, or unexplained weight loss..  I spent 20 minutes dedicated to the care of this patient on the date of this encounter to include pre-visit review of records, face-to-face time with the patient discussing conditions above, post visit ordering of testing, clinical documentation with the electronic health record, making appropriate referrals as documented, and communicating necessary information to the patient's healthcare team.      Lauraine JULIANNA Lites, NP 02/19/2024  10:35 AM             [1]  Social History Tobacco Use   Smoking status: Former    Current packs/day: 0.00    Average packs/day: 2.0 packs/day for 53.0 years (106.0 ttl pk-yrs)    Types: Cigarettes    Start date: 1966    Quit date: 2019    Years since quitting: 7.0    Passive exposure: Never   Smokeless tobacco: Never   Tobacco comments:    Quit smoking 2019  Vaping Use   Vaping status: Never Used  Substance Use Topics   Alcohol use: Not Currently   Drug use: Never  [2]  Allergies Allergen Reactions   Lisinopril Other (See Comments)    Unsure of reaction     Penicillins Hives   Prednisone  Other (See Comments)    Unsure of reaction    Aspirin Hives   "

## 2024-02-19 NOTE — Telephone Encounter (Signed)
 I have ordered the follow up CT Chest.

## 2024-02-19 NOTE — Patient Instructions (Addendum)
 It is good to see you today. I am glad you are doing well. We have reviewed your CT Chest. The right lower lobe nodule we are monitoring remains 1.1 cm, but the solid component has increased in size from 5 mm in August 2025 to 9 mm 02/2024. I will review both scans with Dr. Shelah to see if he feels the solid component has increased in size . Once I have spoken with him we can determine if we will biopsy the nodule again , or recheck a CT Chest in 3 months. Call if you have not heard from me by Friday1/16/2026. Please contact office for sooner follow up if symptoms do not improve or worsen or seek emergency care

## 2024-02-19 NOTE — Telephone Encounter (Signed)
 Called this morning to get ct chest escalated for appointment today

## 2024-02-19 NOTE — Telephone Encounter (Signed)
 I Have called the patient after speaking with Dr. Shelah.He felt the right lower lobe nodule looked pretty similar to him, the solid component is about the same, the ground glass halo looks a tiny bit larger but they measured it at the same.  I think it would be reasonable to follow. Pt. Is in agreement with a 3 month follow up scan and OV follow up 1 week after the scan is completed. I have ordered the scan.Please make sure the 1 week follow up gets scheduled once th scan has been scheduled. Thanks so much

## 2024-02-20 ENCOUNTER — Encounter: Payer: Self-pay | Admitting: Acute Care
